# Patient Record
Sex: Male | Born: 1945
Health system: Southern US, Community
[De-identification: ages and names within clinical notes are randomized; demographics above are authoritative.]

## PROBLEM LIST (undated history)

## (undated) DIAGNOSIS — I672 Cerebral atherosclerosis: Secondary | ICD-10-CM

## (undated) DIAGNOSIS — E785 Hyperlipidemia, unspecified: Secondary | ICD-10-CM

## (undated) DIAGNOSIS — N401 Enlarged prostate with lower urinary tract symptoms: Secondary | ICD-10-CM

## (undated) DIAGNOSIS — R7303 Prediabetes: Secondary | ICD-10-CM

## (undated) DIAGNOSIS — I889 Nonspecific lymphadenitis, unspecified: Secondary | ICD-10-CM

## (undated) DIAGNOSIS — M1990 Unspecified osteoarthritis, unspecified site: Secondary | ICD-10-CM

## (undated) DIAGNOSIS — I10 Essential (primary) hypertension: Secondary | ICD-10-CM

## (undated) DIAGNOSIS — N189 Chronic kidney disease, unspecified: Secondary | ICD-10-CM

## (undated) DIAGNOSIS — G500 Trigeminal neuralgia: Secondary | ICD-10-CM

## (undated) DIAGNOSIS — J029 Acute pharyngitis, unspecified: Secondary | ICD-10-CM

## (undated) DIAGNOSIS — L299 Pruritus, unspecified: Secondary | ICD-10-CM

## (undated) DIAGNOSIS — J9801 Acute bronchospasm: Secondary | ICD-10-CM

## (undated) DIAGNOSIS — G4700 Insomnia, unspecified: Secondary | ICD-10-CM

## (undated) DIAGNOSIS — M109 Gout, unspecified: Secondary | ICD-10-CM

## (undated) DIAGNOSIS — M2010 Hallux valgus (acquired), unspecified foot: Secondary | ICD-10-CM

## (undated) DIAGNOSIS — R0600 Dyspnea, unspecified: Secondary | ICD-10-CM

## (undated) DIAGNOSIS — L309 Dermatitis, unspecified: Secondary | ICD-10-CM

## (undated) DIAGNOSIS — D126 Benign neoplasm of colon, unspecified: Secondary | ICD-10-CM

## (undated) DIAGNOSIS — I639 Cerebral infarction, unspecified: Secondary | ICD-10-CM

## (undated) DIAGNOSIS — M7061 Trochanteric bursitis, right hip: Secondary | ICD-10-CM

## (undated) DIAGNOSIS — K219 Gastro-esophageal reflux disease without esophagitis: Secondary | ICD-10-CM

## (undated) DIAGNOSIS — Z800 Family history of malignant neoplasm of digestive organs: Secondary | ICD-10-CM

## (undated) DIAGNOSIS — K921 Melena: Secondary | ICD-10-CM

## (undated) DIAGNOSIS — J309 Allergic rhinitis, unspecified: Secondary | ICD-10-CM

## (undated) DIAGNOSIS — L84 Corns and callosities: Secondary | ICD-10-CM

## (undated) DIAGNOSIS — F4310 Post-traumatic stress disorder, unspecified: Secondary | ICD-10-CM

## (undated) DIAGNOSIS — N529 Male erectile dysfunction, unspecified: Secondary | ICD-10-CM

## (undated) DIAGNOSIS — M5410 Radiculopathy, site unspecified: Secondary | ICD-10-CM

## (undated) DIAGNOSIS — R21 Rash and other nonspecific skin eruption: Secondary | ICD-10-CM

## (undated) DIAGNOSIS — H04123 Dry eye syndrome of bilateral lacrimal glands: Secondary | ICD-10-CM

## (undated) DIAGNOSIS — J383 Other diseases of vocal cords: Secondary | ICD-10-CM

## (undated) DIAGNOSIS — R972 Elevated prostate specific antigen [PSA]: Secondary | ICD-10-CM

## (undated) DIAGNOSIS — J3489 Other specified disorders of nose and nasal sinuses: Secondary | ICD-10-CM

## (undated) DIAGNOSIS — E559 Vitamin D deficiency, unspecified: Secondary | ICD-10-CM

## (undated) DIAGNOSIS — M65319 Trigger thumb, unspecified thumb: Secondary | ICD-10-CM

## (undated) DIAGNOSIS — G4730 Sleep apnea, unspecified: Secondary | ICD-10-CM

## (undated) DIAGNOSIS — K602 Anal fissure, unspecified: Secondary | ICD-10-CM

## (undated) HISTORY — DX: Trigeminal neuralgia: G50.0

## (undated) HISTORY — DX: Dermatitis, unspecified: L30.9

## (undated) HISTORY — DX: Trochanteric bursitis, right hip: M70.61

## (undated) HISTORY — DX: Anal fissure, unspecified: K60.2

## (undated) HISTORY — PX: HEMORRHOID SURGERY: SHX153

## (undated) HISTORY — DX: Nonspecific lymphadenitis, unspecified: I88.9

## (undated) HISTORY — DX: Hallux valgus (acquired), unspecified foot: M20.10

## (undated) HISTORY — DX: Family history of malignant neoplasm of digestive organs: Z80.0

## (undated) HISTORY — DX: Acute pharyngitis, unspecified: J02.9

## (undated) HISTORY — DX: Benign neoplasm of colon, unspecified: D12.6

## (undated) HISTORY — DX: Acute bronchospasm: J98.01

## (undated) HISTORY — DX: Male erectile dysfunction, unspecified: N52.9

## (undated) HISTORY — DX: Cerebral infarction, unspecified: I63.9

## (undated) HISTORY — DX: Benign prostatic hyperplasia with lower urinary tract symptoms: N40.1

## (undated) HISTORY — DX: Insomnia, unspecified: G47.00

## (undated) HISTORY — DX: Essential (primary) hypertension: I10

## (undated) HISTORY — DX: Trigger thumb, unspecified thumb: M65.319

## (undated) HISTORY — DX: Melena: K92.1

## (undated) HISTORY — DX: Gastro-esophageal reflux disease without esophagitis: K21.9

## (undated) HISTORY — DX: Radiculopathy, site unspecified: M54.10

## (undated) HISTORY — DX: Dyspnea, unspecified: R06.00

## (undated) HISTORY — DX: Dry eye syndrome of bilateral lacrimal glands: H04.123

## (undated) HISTORY — DX: Hyperlipidemia, unspecified: E78.5

## (undated) HISTORY — DX: Allergic rhinitis, unspecified: J30.9

## (undated) HISTORY — DX: Gout, unspecified: M10.9

## (undated) HISTORY — DX: Cerebral atherosclerosis: I67.2

## (undated) HISTORY — DX: Elevated prostate specific antigen (PSA): R97.20

## (undated) HISTORY — DX: Corns and callosities: L84

## (undated) HISTORY — DX: Pruritus, unspecified: L29.9

## (undated) HISTORY — DX: Vitamin D deficiency, unspecified: E55.9

## (undated) HISTORY — PX: HERNIA REPAIR: SHX51

## (undated) HISTORY — DX: Rash and other nonspecific skin eruption: R21

---

## 1995-08-21 DIAGNOSIS — I639 Cerebral infarction, unspecified: Secondary | ICD-10-CM

## 1995-08-21 HISTORY — DX: Cerebral infarction, unspecified: I63.9

## 1999-07-08 ENCOUNTER — Ambulatory Visit: Admission: RE | Admit: 1999-07-08 | Discharge: 1999-07-08 | Payer: Self-pay | Admitting: Internal Medicine

## 1999-12-21 ENCOUNTER — Encounter: Admission: RE | Admit: 1999-12-21 | Discharge: 1999-12-21 | Payer: Self-pay | Admitting: Internal Medicine

## 1999-12-21 ENCOUNTER — Encounter: Payer: Self-pay | Admitting: Internal Medicine

## 2001-06-27 ENCOUNTER — Ambulatory Visit (HOSPITAL_BASED_OUTPATIENT_CLINIC_OR_DEPARTMENT_OTHER): Admission: RE | Admit: 2001-06-27 | Discharge: 2001-06-27 | Payer: Self-pay | Admitting: Internal Medicine

## 2002-04-23 ENCOUNTER — Encounter: Admission: RE | Admit: 2002-04-23 | Discharge: 2002-04-23 | Payer: Self-pay | Admitting: Internal Medicine

## 2002-04-23 ENCOUNTER — Encounter: Payer: Self-pay | Admitting: Internal Medicine

## 2004-10-17 ENCOUNTER — Ambulatory Visit: Payer: Self-pay | Admitting: Internal Medicine

## 2007-01-29 ENCOUNTER — Encounter: Admission: RE | Admit: 2007-01-29 | Discharge: 2007-01-29 | Payer: Self-pay | Admitting: Internal Medicine

## 2008-06-25 ENCOUNTER — Ambulatory Visit (HOSPITAL_COMMUNITY): Admission: RE | Admit: 2008-06-25 | Discharge: 2008-06-25 | Payer: Self-pay | Admitting: Orthopedic Surgery

## 2008-08-20 HISTORY — PX: ROTATOR CUFF REPAIR: SHX139

## 2008-08-26 ENCOUNTER — Ambulatory Visit (HOSPITAL_BASED_OUTPATIENT_CLINIC_OR_DEPARTMENT_OTHER): Admission: RE | Admit: 2008-08-26 | Discharge: 2008-08-27 | Payer: Self-pay | Admitting: Orthopedic Surgery

## 2008-09-17 ENCOUNTER — Encounter: Admission: RE | Admit: 2008-09-17 | Discharge: 2008-09-17 | Payer: Self-pay | Admitting: Neurology

## 2010-12-04 LAB — BASIC METABOLIC PANEL
BUN: 15 mg/dL (ref 6–23)
CO2: 28 mEq/L (ref 19–32)
Calcium: 9.1 mg/dL (ref 8.4–10.5)
Chloride: 104 mEq/L (ref 96–112)
Creatinine, Ser: 1.15 mg/dL (ref 0.4–1.5)
GFR calc Af Amer: >60 mL/min (ref >60)
GFR calc non Af Amer: >60 mL/min (ref >60)
Glucose, Bld: 91 mg/dL (ref 70–99)
Potassium: 3.8 mEq/L (ref 3.5–5.1)
Sodium: 138 mEq/L (ref 135–145)

## 2010-12-04 LAB — POCT HEMOGLOBIN-HEMACUE: Hemoglobin: 13.5 g/dL (ref 13.0–17.0)

## 2011-01-02 NOTE — Op Note (Signed)
[name], [name]              ACCOUNT NO.:  [id number]   MEDICAL RECORD NO.:  [id number]          PATIENT TYPE:  AMB   LOCATION:  DSC                          FACILITY:  [facility]   PHYSICIAN:  Katy Fitch. Sypher, M.D. DATE OF BIRTH:  1946/05/05   DATE OF PROCEDURE:  08/26/2008  DATE OF DISCHARGE:                               OPERATIVE REPORT   PREOPERATIVE DIAGNOSES:  Chronic pain, left shoulder with weakness of  abduction and external rotation and plain film evidence of a type 3  acromion and MRI evidence documenting a retracted supraspinatus and  partial infraspinatus rotator cuff tear with subscapularis tendinopathy  and biceps tendinopathy.   POSTOPERATIVE DIAGNOSES:  Chronic pain, left shoulder with weakness of  abduction and external rotation and plain film evidence of a type 3  acromion and MRI evidence documenting a retracted supraspinatus and  partial infraspinatus rotator cuff tear with subscapularis tendinopathy  and biceps tendinopathy. Confirmation of retracted rotator cuff tear  involving supraspinatus and infraspinatus tendons, grade 1 subscapularis  tendinopathy, adhesive capsulitis, a Buford complex, and significant  biceps tendinopathy with a 50% biceps degenerative tear.   OPERATIONS:  1. Examination of left shoulder under anesthesia.  2. Diagnostic arthroscopy, left shoulder.  3. Arthroscopic debridement of synovitis, capsulectomy, and labral      debridement.  4. Arthroscopic subacromial decompression with coracoacromial ligament      relaxation and correction of a type 3 acromion to a type 1      acromion.  We elected to leave the acromioclavicular joint intact,      as the distal clavicle did not appear to be contributing to      impingement.  5. Open repair of 2-tendon rotator cuff tear involving infraspinatus,      supraspinatus tendons.   OPERATIONS:  Katy Fitch. Sypher, MD   ASSISTANT:  Marveen Reeks Dasnoit, PA-C   ANESTHESIA:  General endotracheal  supplemented by left interscalene  block.   SUPERVISING ANESTHESIOLOGIST:  Kaylyn Layer. Michelle Piper, MD   INDICATIONS:  [name] is a 66 year old gentleman referred through  the courtesy of Dr. Johnella Moloney for evaluation of a painful and weak  left shoulder.  Clinical examination revealed weakness of abduction and  external rotation and findings compatible with a rotator cuff tear.  Plain films of the shoulder documented a type 3 acromion.  [name]  had similar bony pathology on the right.  An MRI of the left shoulder  confirmed a retracted rotator cuff tear involving supraspinatus,  infraspinatus tendons.   After a lengthy informed consent in the office, he is brought to the  operating room at this time anticipating a diagnostic arthroscopy,  appropriate debridement, subacromial decompression, correction of the  type 3 acromion, possible distal clavicle resection, and repair of the  rotator cuff.   Preoperatively, questions invited and answered with [name] in  detail.  He was informed of the potential risks and benefits of surgery,  the benefits being pain relief and recovery of strength and function.  The risks being infection, failure to relieve all his pain, anesthetic  complications, possible late hardware  issues.   He was interviewed preoperatively by Dr. Michelle Piper and agreed to a left  interscalene block.  This was placed without complication in the holding  area.   PROCEDURE:  [name] was brought to the operating room and placed  in a supine position on the operating table.   Following the induction of general endotracheal anesthesia under Dr.  Deirdre Priest direct supervision, [name] was carefully positioned in a  beach chair position with the aid of a torso and head holder designed  from arthroscopy.  The entire left upper extremity forequarter prepped  with DuraPrep and draped with Impervious arthroscopy drapes.  An  examination of the left shoulder under  anesthesia was accomplished  revealing combined elevation 170, external rotation 70, internal  rotation 40, and lack of extension of about 20 degrees due to capsular  tightness.  The scope was then placed through a posterior portal after  anterior instrumentation with a switching stick.  Diagnostic arthroscopy  confirmed abundant adhesive capsulitis granulation tissue, a Buford  complex involving the anterior superior glenohumeral ligament.  A  significant degree of hypertrophic scar anteriorly obliterating easy  view of the subscapularis and a grade 1 subscapularis degenerative tear.  The deep surface of the supraspinatus and infraspinatus tendons were  inspected and found to reveal a 3-cm retracted tear of the conjoined  supraspinatus, infraspinatus tendons.  This was degenerative in nature.  There was considerable articular side degeneration of the infraspinatus.   Suction shaver was used to debride the infraspinatus over the greater  tuberosity.   The scope was then removed from the glenohumeral joint and placed in a  subacromial space.  The bursa was cleared and the type 3 acromion  documented with the digital camera.  The coracoacromial ligament was  released followed by leveling the acromion to a type 1 morphology while  preserving the coracoacromial ligament.  The capsule of the [facility] joint was  identified and not found to be problematic.  After limited debridement  of the fat pad, we elected to leave the [facility] joint intact.  Hemostasis was  achieved followed by conversion to a muscle-splitting incision for mini  open repair of the rotator cuff.   A 4-cm incision was fashioned at the anterolateral corner of the  acromion followed by splitting of the deltoid.  A Chung retractor was  placed followed by bursectomy.  The margins of the tear were freshened  with a scalpel and rongeur followed by placement of a baseball stitch of  #2 FiberWire converging the margins of the tear.  The  footprint at the  greater tuberosity was 4 cm in width and approximately 15-17 mm deep.  This was decorticated and the profile of the greater tuberosity lowered  to the bur followed by placement of a medial Bio-Corkscrew at the  junction between the infraspinatus and supraspinatus tendons at the  joint line.   The through bone sutures were then passed through bone tunnels,  tensioned appropriately, and a pair of mattress sutures were used to  inset the margin of the repair at the articular surface.  The medial  suture was tied with a knot pusher due to our inability to access the  subacromial space.   The through bone sutures were tensioned and anatomic footprint was  reestablished.   The scope was then replaced in the glenohumeral joint confirming  correction of the rotator cuff tear with inset of the tendons to the  articular margin.  The joint was debrided  of all clot and other debris  and irrigated.  The subacromial space was likewise irrigated followed by  repair of the deltoid split with corner suture of #2 FiberWire and  figure-of-eight sutures of 0 Vicryl.  Skin was repaired with  subcutaneous suture of 2-0 Vicryl and intradermal 3-0 Prolene.  There  were no apparent complications.   [name] tolerated the surgery and the anesthesia well.  He was  transferred to the recovery room in stable signs.  He will be discharged  to [facility] for observation of his vital  signs, appropriate analgesics in the form of p.o. and IV Dilaudid, and  Ancef 1 g IV q.8 h. x3 doses with a prophylactic antibiotic.      Katy Fitch Sypher, M.D.  Electronically Signed     RVS/MEDQ  D:  08/26/2008  T:  08/27/2008  Job:  [id number]   cc:   Candyce Churn, M.D.

## 2011-04-27 ENCOUNTER — Encounter (INDEPENDENT_AMBULATORY_CARE_PROVIDER_SITE_OTHER): Payer: Self-pay | Admitting: General Surgery

## 2011-04-30 ENCOUNTER — Encounter (INDEPENDENT_AMBULATORY_CARE_PROVIDER_SITE_OTHER): Payer: Self-pay | Admitting: General Surgery

## 2011-04-30 ENCOUNTER — Ambulatory Visit (INDEPENDENT_AMBULATORY_CARE_PROVIDER_SITE_OTHER): Payer: 59 | Admitting: General Surgery

## 2011-04-30 VITALS — BP 120/82 | HR 60 | Temp 98.3°F | Ht 65.0 in | Wt 182.0 lb

## 2011-04-30 DIAGNOSIS — K602 Anal fissure, unspecified: Secondary | ICD-10-CM

## 2011-04-30 DIAGNOSIS — K648 Other hemorrhoids: Secondary | ICD-10-CM

## 2011-04-30 DIAGNOSIS — K644 Residual hemorrhoidal skin tags: Secondary | ICD-10-CM

## 2011-04-30 NOTE — Patient Instructions (Signed)
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 This is the source of your severe pain and possibly contributing to your bleeding. Your internal hemorrhoids may also be contributing to your bleeding. Your external hemorrhoids are not causing any problem. We will schedule you for surgery to repair the anal fissure, perform a sphincterotomy, and inject  sclerosing solution into internal hemorrhoids as necessary.

## 2011-04-30 NOTE — Progress Notes (Signed)
Chief Complaint  Patient presents with  . Follow-up    anal fissure    HPI [name] is a 65 y.o. male.    This patient returns to see me because he is very symptomatic from his anal fissure. He says that he has recovered from his shoulder surgery. He got temporary relief from the medical therapy that was prescribed when I saw him back in May of 2011. He now has soft bowel movements but almost daily severe pain with  during and after bowel movements, and he sees some blood as well. He does know that he has external hemorrhoids but he does not think they are bothering him very much. Last colonoscopy 2012-[facility].  Significant medications are Diovan, HCT, Lipitor, daily aspirin, stool softeners and vitamin D.  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 He has minimal left-sided residual symptoms. He does not have any heart disease diabetes or pulmonary disease. HPI  Past Medical History  Diagnosis Date  . Hypertension   . Hyperlipidemia   . Hemorrhoids   . Anal fissure     Past Surgical History  Procedure Date  . Rotator cuff repair 2010    left    Family History  Problem Relation Age of Onset  . Heart disease Mother   . Cancer Father     Social History History  Substance Use Topics  . Smoking status: Former Games developer  . Smokeless tobacco: Not on file  . Alcohol Use: No    No Known Allergies  Current Outpatient Prescriptions  Medication Sig Dispense Refill  . aspirin 325 MG tablet Take 325 mg by mouth daily.        Marland Kitchen atorvastatin (LIPITOR) 10 MG tablet Take 10 mg by mouth daily.        . cholecalciferol (VITAMIN D) 1000 UNITS tablet Take 1,000 Units by mouth daily.        [name] Calcium (STOOL SOFTENER PO) Take by mouth daily.        . valsartan-hydrochlorothiazide (DIOVAN-HCT) 160-12.5 MG per tablet Take 1 tablet by mouth daily.          Review of  Systems Review of Systems  Respiratory: Negative.   Cardiovascular: Negative.   Gastrointestinal: Positive for constipation, blood in stool and rectal pain. Negative for nausea, vomiting, abdominal pain, diarrhea, abdominal distention and anal bleeding.  Genitourinary: Negative.   Musculoskeletal: Negative.   Skin: Negative.   Neurological: Positive for weakness and numbness. Negative for dizziness, tremors, seizures, syncope, facial asymmetry, speech difficulty and headaches.  Hematological: Negative.   Psychiatric/Behavioral: Negative.     Blood pressure 120/82, pulse 60, temperature 98.3 F (36.8 C), temperature source Temporal, height 5\' 5"  (1.651 m), weight 182 lb (82.555 kg).  Physical Exam Physical Exam  Constitutional: He is oriented to person, place, and time. He appears well-developed and well-nourished. No distress.  HENT:  Head: Normocephalic and atraumatic.  Nose: Nose normal.  Mouth/Throat: No oropharyngeal exudate.  Eyes: Conjunctivae are normal. Pupils are equal, round, and reactive to light. Left eye exhibits no discharge. No scleral icterus.  Neck: Neck supple. No JVD present. No tracheal deviation present. No thyromegaly present.  Cardiovascular: Normal rate, regular rhythm, normal heart sounds and intact distal pulses.  Exam reveals no gallop.   No murmur heard. Pulmonary/Chest: Effort normal. No respiratory distress. He has no wheezes. He has no rales. He exhibits  no tenderness.  Abdominal: Bowel sounds are normal. He exhibits no distension. There is no tenderness. There is no rebound and no guarding.  Genitourinary:     Musculoskeletal: Normal range of motion. He exhibits no edema and no tenderness.  Lymphadenopathy:    He has no cervical adenopathy.  Neurological: He is alert and oriented to person, place, and time. He exhibits normal muscle tone.  Skin: Skin is warm and dry. No rash noted. No erythema. No pallor.  Psychiatric: He has a normal mood and  affect. His behavior is normal. Judgment and thought content normal.    Data Reviewed I have reviewed our old chart with previous rectal information.  Assessment    Chronic anal fissure, posterior midline, very symptomatic.  Internal hemorrhoids, possibly contributing to bleeding.  External hemorrhoids, asymptomatic.  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  Hypertension.     Plan    Scheduled for examination under anesthesia, repair anal fissure, internal sphincterotomy, inject sclerosing solution into internal hemorrhoids as necessary.  There does not appear to be any indication for surgical intervention on his small circumferential external hemorrhoids.  Discontinue aspirin two days preop.       [name] M 04/30/2011, 9:28 AM

## 2011-05-08 ENCOUNTER — Ambulatory Visit (HOSPITAL_COMMUNITY)
Admission: RE | Admit: 2011-05-08 | Discharge: 2011-05-08 | Disposition: A | Discharge status: Home / Self Care | Payer: 59 | Source: Ambulatory Visit | Attending: General Surgery | Admitting: General Surgery

## 2011-05-08 ENCOUNTER — Other Ambulatory Visit (INDEPENDENT_AMBULATORY_CARE_PROVIDER_SITE_OTHER): Payer: Self-pay | Admitting: General Surgery

## 2011-05-08 ENCOUNTER — Encounter (HOSPITAL_COMMUNITY): Payer: 59

## 2011-05-08 DIAGNOSIS — I10 Essential (primary) hypertension: Secondary | ICD-10-CM | POA: Insufficient documentation

## 2011-05-08 DIAGNOSIS — Z01818 Encounter for other preprocedural examination: Secondary | ICD-10-CM

## 2011-05-08 DIAGNOSIS — Z79899 Other long term (current) drug therapy: Secondary | ICD-10-CM | POA: Insufficient documentation

## 2011-05-08 DIAGNOSIS — K602 Anal fissure, unspecified: Secondary | ICD-10-CM | POA: Insufficient documentation

## 2011-05-08 DIAGNOSIS — Z8673 Personal history of transient ischemic attack (TIA), and cerebral infarction without residual deficits: Secondary | ICD-10-CM | POA: Insufficient documentation

## 2011-05-08 DIAGNOSIS — Z01810 Encounter for preprocedural cardiovascular examination: Secondary | ICD-10-CM | POA: Insufficient documentation

## 2011-05-08 DIAGNOSIS — Z01812 Encounter for preprocedural laboratory examination: Secondary | ICD-10-CM | POA: Insufficient documentation

## 2011-05-08 DIAGNOSIS — K648 Other hemorrhoids: Secondary | ICD-10-CM | POA: Insufficient documentation

## 2011-05-08 LAB — DIFFERENTIAL
Lymphocytes Relative: 26 % (ref 12–46)
Monocytes Absolute: 0.5 10*3/uL (ref 0.1–1.0)
Monocytes Relative: 7 % (ref 3–12)
Neutro Abs: 4.1 10*3/uL (ref 1.7–7.7)

## 2011-05-08 LAB — SURGICAL PCR SCREEN: MRSA, PCR: NEGATIVE

## 2011-05-08 LAB — CBC
HCT: 38.8 % — ABNORMAL LOW (ref 39.0–52.0)
Hemoglobin: 12.8 g/dL — ABNORMAL LOW (ref 13.0–17.0)
MCH: 27.1 pg (ref 26.0–34.0)
MCHC: 33 g/dL (ref 30.0–36.0)
RBC: 4.72 MIL/uL (ref 4.22–5.81)

## 2011-05-08 LAB — URINALYSIS, ROUTINE W REFLEX MICROSCOPIC
Glucose, UA: NEGATIVE mg/dL
Hgb urine dipstick: NEGATIVE
Ketones, ur: NEGATIVE mg/dL
Protein, ur: NEGATIVE mg/dL
pH: 6.5 (ref 5.0–8.0)

## 2011-05-08 LAB — COMPREHENSIVE METABOLIC PANEL
AST: 19 U/L (ref 0–37)
Albumin: 3.8 g/dL (ref 3.5–5.2)
Calcium: 9.4 mg/dL (ref 8.4–10.5)
Creatinine, Ser: 0.97 mg/dL (ref 0.50–1.35)
Total Protein: 7.6 g/dL (ref 6.0–8.3)

## 2011-05-11 ENCOUNTER — Ambulatory Visit (HOSPITAL_COMMUNITY)
Admission: RE | Admit: 2011-05-11 | Discharge: 2011-05-11 | Disposition: A | Discharge status: Home / Self Care | Payer: 59 | Source: Ambulatory Visit | Attending: General Surgery | Admitting: General Surgery

## 2011-05-11 DIAGNOSIS — K219 Gastro-esophageal reflux disease without esophagitis: Secondary | ICD-10-CM | POA: Insufficient documentation

## 2011-05-11 DIAGNOSIS — I10 Essential (primary) hypertension: Secondary | ICD-10-CM | POA: Insufficient documentation

## 2011-05-11 DIAGNOSIS — Z01818 Encounter for other preprocedural examination: Secondary | ICD-10-CM | POA: Insufficient documentation

## 2011-05-11 DIAGNOSIS — Z01810 Encounter for preprocedural cardiovascular examination: Secondary | ICD-10-CM | POA: Insufficient documentation

## 2011-05-11 DIAGNOSIS — K602 Anal fissure, unspecified: Secondary | ICD-10-CM | POA: Insufficient documentation

## 2011-05-11 DIAGNOSIS — Z01812 Encounter for preprocedural laboratory examination: Secondary | ICD-10-CM | POA: Insufficient documentation

## 2011-05-11 DIAGNOSIS — K648 Other hemorrhoids: Secondary | ICD-10-CM | POA: Insufficient documentation

## 2011-05-11 HISTORY — PX: ANAL FISSURE REPAIR: SHX2312

## 2011-05-12 NOTE — Op Note (Signed)
NAMEJAMARL, [name]              ACCOUNT NO.:  [id number]  MEDICAL RECORD NO.:  [id number]  LOCATION:  DAYL                         FACILITY:  [facility]  PHYSICIAN:  Angelia Mould. Derrell Lolling, M.D.DATE OF BIRTH:  06/27/1946  DATE OF PROCEDURE:  05/11/2011 DATE OF DISCHARGE:                              OPERATIVE REPORT   PREOPERATIVE DIAGNOSES: 1. Chronic anal fissure, posterior midline. 2. Bleeding internal hemorrhoids.  POSTOPERATIVE DIAGNOSES: 1. Chronic anal fissure, posterior midline. 2. Bleeding internal hemorrhoids.  OPERATION PERFORMED:  Examination under anesthesia, lateral internal sphincterotomy (right lateral), anal stretch, inject sclerosing solution internal hemorrhoids, 3 columns.  SURGEON:  Angelia Mould. Derrell Lolling, MD  OPERATIVE INDICATIONS:  This is a 65 year old African American gentleman who has become very symptomatic from an anal fissure.  He has had problems off and on, but basically has not responded well to topical therapy and diltiazem cream and stool softeners.  His bowel movements were soft, but he has pain with every bowel movement and he sees some blood as well.  He has had a colonoscopy this year which showed no other abnormalities.  On exam, he has a chronic anal fissure in the posterior midline.  He has small circumferential external hemorrhoids, which are not symptomatic.  He is brought to the operating room electively.  OPERATIVE FINDINGS:  The patient had a chronic anal fissure in the posterior midline.  The sphincter tone was increased, but there was no stenosis.  He had internal hemorrhoids right anterior, right posterior, and left lateral.  There was no other mucosal abnormality.  OPERATIVE TECHNIQUE:  Following the induction of general endotracheal anesthesia, intravenous antibiotics were given.  The patient was placed in a lithotomy position.  The perianal area was prepped and draped in a sterile fashion.  Surgical time-out was held identifying  correct patient and correct procedure.  External exam revealed minimal, but circumferential external hemorrhoids.  Digital rectal exam revealed increased sphincter tone, but no stenosis.  I slowly dilated the sphincter until I could insert the operating anoscope.  Findings were as described above.  I positioned the anoscope to expose the right lateral anal wall.  I could easily palpate the intersphincteric groove between the internal and external sphincters.  I took a #11 knife blade and inserted it between the internal and external sphincters and then turned it toward the lumen and divided the sphincter muscle leaving the mucosa intact.  I removed the knife and then held some pressure on this.  I then slowly dilated the anal sphincter until I could get 4 fingers in.  I took sclerosing solution with phenol and almond oil, which had been compounded by our pharmacy.  I injected this into the internal hemorrhoids in the right anterior, right posterior, and left lateral positions.  The anal fissure in the posterior midline was cauterized extensively and a little bit of redundant skin tissue on the anoderm was excised.  This wound was left open.  I then took 20 cc of Exparel and mixed it with 20 cc of injectable saline.  I injected this extensively into the perianal sphincter muscles for postop analgesia.  The wound was observed for several minutes.  There was no bleeding.  External bandages were placed.  He tolerated the procedure well, was taken to the recovery room in stable condition.  Estimated blood loss was about 10 cc. Complications, none.  Sponge, needle, and instrument counts were correct.     Angelia Mould. Derrell Lolling, M.D.     HMI/MEDQ  D:  05/11/2011  T:  05/11/2011  Job:  [id number]  cc:   Pearla Dubonnet, M.D. Fax: [phone]  Electronically Signed by Claud Kelp M.D. on 05/12/2011 08:17:38 AM

## 2011-05-28 ENCOUNTER — Encounter (INDEPENDENT_AMBULATORY_CARE_PROVIDER_SITE_OTHER): Payer: Self-pay | Admitting: General Surgery

## 2011-05-28 ENCOUNTER — Ambulatory Visit (INDEPENDENT_AMBULATORY_CARE_PROVIDER_SITE_OTHER): Payer: 59 | Admitting: General Surgery

## 2011-05-28 VITALS — BP 110/74 | HR 64 | Temp 97.8°F | Resp 16 | Ht 65.0 in | Wt 181.6 lb

## 2011-05-28 DIAGNOSIS — Z9889 Other specified postprocedural states: Secondary | ICD-10-CM

## 2011-05-28 NOTE — Progress Notes (Signed)
Subjective:     Patient ID: [name], male   DOB: 1946-02-10, 65 y.o.   MRN: [id number]  HPI This 65 year old gentleman underwent examination under anesthesia, lateral internal sphincterotomy, anal stretch, sclerosis of internal hemorrhoids. He had an anal fissure in the posterior midline and internal hemorrhoids.  He states that his pain is 90% better and he has minimal bleeding. He still wears a small pad and is still using baby wipes. He is keeping his stool soft. He was to go back to work. He is very pleased with his progress. Review of Systems     Objective:   Physical Exam The patient looks well and is in good spirits.  Anal exam shows a clean healing wound in the posterior midline, consistent with his anal fissure. He has a small soft external hemorrhoids. There is no cellulitis or purulence. There is no tenderness.    Assessment:     Chronic anal fissure, posterior midline, and internal hemorrhoids, symptomatic, doing well following injection of sclerosing solution into internal hemorrhoids, sphincterotomy and repair of anal fissure posterior midline.  External hemorrhoids, asymptomatic.   Plan:     He is advised to stay well hydrated, take daily stool softeners, and adhere to a high-fiber low-fat diet.  He is told that the wound should completely healed and all of the pain and all of the bleeding should resolve within 4 weeks.  If all symptoms resolved as anticipated, he may return to see me p.r.n.  If symptoms persist beyond that point, he is advised to return to see me for an exam.

## 2011-05-28 NOTE — Patient Instructions (Signed)
Your wounds appear to be healing very well. I would expect all discomfort and all bleeding to go away within 4 weeks. Return to see me if any bleeding or pain persists beyond that point in time. Stay well hydrated. Each lots of fruits and vegetables. Daily fiber supplement is advised.

## 2013-12-15 ENCOUNTER — Ambulatory Visit (INDEPENDENT_AMBULATORY_CARE_PROVIDER_SITE_OTHER): Payer: 59 | Admitting: Diagnostic Neuroimaging

## 2013-12-15 ENCOUNTER — Encounter: Payer: Self-pay | Admitting: Diagnostic Neuroimaging

## 2013-12-15 ENCOUNTER — Other Ambulatory Visit (INDEPENDENT_AMBULATORY_CARE_PROVIDER_SITE_OTHER): Payer: Self-pay

## 2013-12-15 ENCOUNTER — Encounter (INDEPENDENT_AMBULATORY_CARE_PROVIDER_SITE_OTHER): Payer: Self-pay

## 2013-12-15 VITALS — BP 120/74 | HR 72 | Ht 64.5 in | Wt 187.0 lb

## 2013-12-15 DIAGNOSIS — Z0289 Encounter for other administrative examinations: Secondary | ICD-10-CM

## 2013-12-15 DIAGNOSIS — H02402 Unspecified ptosis of left eyelid: Secondary | ICD-10-CM

## 2013-12-15 DIAGNOSIS — R51 Headache: Secondary | ICD-10-CM

## 2013-12-15 DIAGNOSIS — R519 Headache, unspecified: Secondary | ICD-10-CM

## 2013-12-15 DIAGNOSIS — H02409 Unspecified ptosis of unspecified eyelid: Secondary | ICD-10-CM

## 2013-12-15 NOTE — Progress Notes (Signed)
[facility]  PATIENT: [name] DOB: August 16, 1946  REFERRING CLINICIAN: Zaldivar HISTORY FROM: patient  REASON FOR VISIT: new consult   HISTORICAL  CHIEF COMPLAINT:  Chief Complaint  Patient presents with  . 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  Patient and his coworkers and family have noticed intermittent left eyelid drooping, typically in the afternoon around 2 to 3:00. Symptoms started in 2011. Symptoms have fluctuated on a day-to-day basis. Sometimes his left eyelid is drooping so much that it is completely shut. He denies any double vision, blurred vision, swallowing problems, slurred speech, breathing problems, generalized muscle weakness. No neck weakness. Patient was referred to oculoplastic surgeon for ptosis repair, who then referred patient to me to rule out any other neurologic cause.  Patient's left ptosis seems to be worse when he is having headache on left side. Patient continues to have developed aching left occipital, temporal, frontal, periauricular pain sometimes with numbness.  REVIEW OF SYSTEMS: Full 14 system review of systems performed and notable only for snoring headache numbness weakness.  ALLERGIES: No Known Allergies  HOME MEDICATIONS: Outpatient Prescriptions Prior to Visit  Medication Sig Dispense Refill  . aspirin 325 MG tablet Take 325 mg by mouth daily.        Marland Kitchen atorvastatin (LIPITOR) 10 MG tablet Take 10 mg by mouth daily.        . cholecalciferol (VITAMIN D) 1000 UNITS tablet Take 1,000 Units by mouth daily.        Mariane Baumgarten Calcium (STOOL SOFTENER PO) Take by mouth daily.        . valsartan-hydrochlorothiazide (DIOVAN-HCT) 160-12.5 MG per tablet Take  1 tablet by mouth daily.         No facility-administered medications prior to visit.    PAST MEDICAL HISTORY: Past Medical History  Diagnosis Date  . Hypertension   . Hyperlipidemia   . Hemorrhoids   . Anal fissure     PAST SURGICAL HISTORY: Past Surgical History  Procedure Laterality Date  . Rotator cuff repair  2010    left  . Anal fissure repair  05/11/11    FAMILY HISTORY: Family History  Problem Relation Age of Onset  . Heart disease Mother   . Cancer Father     stomach  . Cancer Paternal Uncle     prostate    SOCIAL HISTORY:  History   Social History  . Marital Status: Married    Spouse Name: N/A    Number of Children: 1  . Years of Education: masters   Occupational History  .  [employer]   Social History Main Topics  . Smoking status: Former Research scientist (life sciences)  . Smokeless tobacco: Not on file  . Alcohol Use: No  . Drug Use: No  . Sexual Activity: Not on file   Other Topics Concern  . Not on file   Social History Narrative   Patient lives at home with his wife.     PHYSICAL EXAM  Filed Vitals:   12/15/13 1126  BP: 120/74  Pulse: 72  Height: 5' 4.5" (1.638 m)  Weight: 187 lb (84.823 kg)    Not recorded    Body mass index is 31.61 kg/(m^2).  GENERAL EXAM: Patient is in no distress; well developed, nourished  and groomed; neck is supple; NECK EXT/FLEX 5/5.  CARDIOVASCULAR: Regular rate and rhythm, no murmurs, no carotid bruits  NEUROLOGIC: MENTAL STATUS: awake, alert, oriented to person, place and time, recent and remote memory intact, normal attention and concentration, language fluent, comprehension intact, naming intact, fund of knowledge appropriate CRANIAL NERVE: no papilledema on fundoscopic exam, pupils equal and reactive to light, visual fields full to confrontation, extraocular muscles intact, no nystagmus, facial sensation and strength symmetric, MILD LEFT PTOSIS, FLUCTUATES DURING EXAM. NO SIGNIFICANT WORSENING AFTER 1 MINUTE  UPGAZE. NO DIPLOPIA. hearing intact, palate elevates symmetrically, uvula midline, shoulder shrug symmetric, tongue midline. ICE PACK TEST ON LEFT EYE NEGATIVE. MOTOR: normal bulk and tone, full strength in the BUE, BLE SENSORY: normal and symmetric to light touch, pinprick, temperature, vibration; EXCEPT MILD DECR VIB IN LEFT LEG. COORDINATION: finger-nose-finger, fine finger movements normal REFLEXES: deep tendon reflexes present and symmetric GAIT/STATION: narrow based gait; able to walk on toes, heels and tandem; romberg is negative    DIAGNOSTIC DATA (LABS, IMAGING, TESTING) - I reviewed patient records, labs, notes, testing and imaging myself where available.  Lab Results  Component Value Date   WBC 6.6 05/08/2011   HGB 12.8* 05/08/2011   HCT 38.8* 05/08/2011   MCV 82.2 05/08/2011   PLT 182 05/08/2011      Component Value Date/Time   NA 137 05/08/2011 0905   K 4.0 05/08/2011 0905   CL 100 05/08/2011 0905   CO2 29 05/08/2011 0905   GLUCOSE 97 05/08/2011 0905   BUN 16 05/08/2011 0905   CREATININE 0.97 05/08/2011 0905   CALCIUM 9.4 05/08/2011 0905   PROT 7.6 05/08/2011 0905   ALBUMIN 3.8 05/08/2011 0905   AST 19 05/08/2011 0905   ALT 19 05/08/2011 0905   ALKPHOS 61 05/08/2011 0905   BILITOT 0.6 05/08/2011 0905   GFRNONAA >60 05/08/2011 0905   GFRAA >60 05/08/2011 0905   No results found for this basename: CHOL, HDL, LDLCALC, LDLDIRECT, TRIG, CHOLHDL   No results found for this basename: HGBA1C   No results found for this basename: VITAMINB12   No results found for this basename: TSH      ASSESSMENT AND PLAN  68 y.o. year old male here with intermittent, fluctuating left-sided ptosis since 2011, sometimes associated with increasing left-sided headaches. Will pursue further workup.  Ddx: ocular myasthenia, intracranial aneurysm, cranial neuropathy (CNIII), CNS inflamm/vascular, age related ptosis, left horner's, myopathy, thyroid disease  PLAN: Orders Placed This Encounter    Procedures  . MR Brain W Wo Contrast  . MR MRA HEAD WO CONTRAST  . Acetylcholine Receptor, Binding  . CK  . Aldolase  . TSH   Return in about 6 months (around 06/16/2014).    Penni Bombard, MD 11/29/8784, 76:72 PM Certified in Neurology, Neurophysiology and [facility] [address], [place], [place] [address] [phone]

## 2013-12-15 NOTE — Patient Instructions (Signed)
I will check additional testing. 

## 2013-12-17 LAB — ALDOLASE: Aldolase: 5.4 U/L (ref 3.3–10.3)

## 2013-12-17 LAB — TSH: TSH: 1.06 u[IU]/mL (ref 0.450–4.500)

## 2013-12-17 LAB — CK: Total CK: 268 U/L — ABNORMAL HIGH (ref 24–204)

## 2013-12-17 LAB — ACETYLCHOLINE RECEPTOR, BINDING: AChR Binding Ab, Serum: 0.08 nmol/L (ref 0.00–0.24)

## 2013-12-24 ENCOUNTER — Ambulatory Visit (INDEPENDENT_AMBULATORY_CARE_PROVIDER_SITE_OTHER): Payer: 59

## 2013-12-24 DIAGNOSIS — H02402 Unspecified ptosis of left eyelid: Secondary | ICD-10-CM

## 2013-12-24 DIAGNOSIS — H02409 Unspecified ptosis of unspecified eyelid: Secondary | ICD-10-CM

## 2013-12-24 DIAGNOSIS — R51 Headache: Secondary | ICD-10-CM

## 2013-12-24 DIAGNOSIS — R519 Headache, unspecified: Secondary | ICD-10-CM

## 2013-12-24 MED ORDER — GADOPENTETATE DIMEGLUMINE 469.01 MG/ML IV SOLN: 14.0000 mL | Freq: Once | INTRAVENOUS | Status: AC | PRN

## 2014-01-25 ENCOUNTER — Telehealth: Payer: Self-pay | Admitting: Diagnostic Neuroimaging

## 2014-01-25 NOTE — Telephone Encounter (Signed)
Patient requesting MRI results

## 2014-01-25 NOTE — Telephone Encounter (Signed)
Called and left Vm message that MRI results have not been reviewed by physician, once reviewed will call back with those results.

## 2014-01-29 NOTE — Telephone Encounter (Signed)
I called patient and reviewed. Nothing to explain symptoms. Labs notable for slight elevation of CK. Will see patient at next visit and repeat CK and check anti-MUSK ab. -VRP

## 2014-06-16 ENCOUNTER — Encounter: Payer: Self-pay | Admitting: Diagnostic Neuroimaging

## 2014-06-16 ENCOUNTER — Ambulatory Visit (INDEPENDENT_AMBULATORY_CARE_PROVIDER_SITE_OTHER): Payer: 59 | Admitting: Diagnostic Neuroimaging

## 2014-06-16 VITALS — BP 125/80 | HR 80 | Temp 98.4°F | Ht 65.5 in | Wt 188.4 lb

## 2014-06-16 DIAGNOSIS — R51 Headache: Secondary | ICD-10-CM

## 2014-06-16 DIAGNOSIS — R519 Headache, unspecified: Secondary | ICD-10-CM

## 2014-06-16 DIAGNOSIS — H02402 Unspecified ptosis of left eyelid: Secondary | ICD-10-CM

## 2014-06-16 NOTE — Patient Instructions (Signed)
I will check additional testing. 

## 2014-06-16 NOTE — Progress Notes (Signed)
[facility]  PATIENT: [name] DOB: July 14, 1946  REFERRING CLINICIAN: Zaldivar HISTORY FROM: patient  REASON FOR VISIT: follow up   HISTORICAL  CHIEF COMPLAINT:  Chief Complaint  Patient presents with  . Follow-up    HISTORY OF PRESENT ILLNESS:   UPDATE 06/16/14: Since last visit, doing well. No new events. Left ptosis, left sided headaches, left scalp numbness stable. These come independently. Labs and MRIs reviewed.  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 Patient and his coworkers and family have noticed intermittent left eyelid drooping, typically in the afternoon around 2 to 3:00. Symptoms started in 2011. Symptoms have fluctuated on a day-to-day basis. Sometimes his left eyelid is drooping so much that it is completely shut. He denies any double vision, blurred vision, swallowing problems, slurred speech, breathing problems, generalized muscle weakness. No neck weakness. Patient was referred to oculoplastic surgeon for ptosis repair, who then referred patient to me to rule out any other neurologic cause. Patient's left ptosis seems to be worse when he is having headache on left side. Patient continues to have developed aching left occipital, temporal, frontal, periauricular pain sometimes with numbness.  REVIEW OF SYSTEMS: Full 14 system review of systems performed and notable only for runny nose cough apnea snoring headache headache numbness.  ALLERGIES: No Known Allergies  HOME MEDICATIONS: Outpatient Prescriptions Prior to Visit  Medication Sig Dispense Refill  . aspirin 325 MG tablet Take 325 mg by mouth daily.        Marland Kitchen atorvastatin (LIPITOR) 10 MG tablet Take 10 mg by mouth daily.        . cholecalciferol (VITAMIN D) 1000 UNITS  tablet Take 1,000 Units by mouth daily.        Mariane Baumgarten Calcium (STOOL SOFTENER PO) Take by mouth daily.        . valsartan-hydrochlorothiazide (DIOVAN-HCT) 160-12.5 MG per tablet Take 1 tablet by mouth daily.         No facility-administered medications prior to visit.    PAST MEDICAL HISTORY: Past Medical History  Diagnosis Date  . Hypertension   . Hyperlipidemia   . Hemorrhoids   . Anal fissure     PAST SURGICAL HISTORY: Past Surgical History  Procedure Laterality Date  . Rotator cuff repair  2010    left  . Anal fissure repair  05/11/11    FAMILY HISTORY: Family History  Problem Relation Age of Onset  . Heart disease Mother   . Cancer Father     stomach  . Cancer Paternal Uncle     prostate    SOCIAL HISTORY:  History   Social History  . Marital Status: Married    Spouse Name: N/A    Number of Children: 1  . Years of Education: masters   Occupational History  .  [employer]   Social History Main Topics  . Smoking status: Former Research scientist (life sciences)  . Smokeless tobacco: Not on file  . Alcohol Use: No  . Drug Use: No  . Sexual Activity: Not on file   Other Topics Concern  . Not on file   Social History Narrative   Patient lives at home with his wife.     PHYSICAL EXAM  Filed Vitals:   06/16/14 1409  BP: 125/80  Pulse: 80  Temp: 98.4 F (36.9 C)  TempSrc: Oral  Height: 5'  5.5" (1.664 m)  Weight: 188 lb 6.4 oz (85.458 kg)    Not recorded    Body mass index is 30.86 kg/(m^2).  GENERAL EXAM: Patient is in no distress; well developed, nourished and groomed; neck is supple; NECK EXT/FLEX 5/5.  CARDIOVASCULAR: Regular rate and rhythm, no murmurs, no carotid bruits  NEUROLOGIC: MENTAL STATUS: awake, alert, oriented to person, place and time, recent and remote memory intact, normal attention and concentration, language fluent, comprehension intact, naming intact, fund of knowledge appropriate CRANIAL NERVE: no papilledema on fundoscopic exam,  pupils equal and reactive to light, visual fields full to confrontation, extraocular muscles intact, no nystagmus, facial sensation and strength symmetric, MINIMAL LEFT PTOSIS, hearing intact, palate elevates symmetrically, uvula midline, shoulder shrug symmetric, tongue midline.  MOTOR: normal bulk and tone, full strength in the BUE, BLE SENSORY: normal and symmetric to light touch  COORDINATION: finger-nose-finger, fine finger movements normal REFLEXES: deep tendon reflexes present and symmetric GAIT/STATION: narrow based gait; able to walk on toes, heels and tandem; romberg is negative    DIAGNOSTIC DATA (LABS, IMAGING, TESTING) - I reviewed patient records, labs, notes, testing and imaging myself where available.  Lab Results  Component Value Date   WBC 6.6 05/08/2011   HGB 12.8* 05/08/2011   HCT 38.8* 05/08/2011   MCV 82.2 05/08/2011   PLT 182 05/08/2011      Component Value Date/Time   NA 137 05/08/2011 0905   K 4.0 05/08/2011 0905   CL 100 05/08/2011 0905   CO2 29 05/08/2011 0905   GLUCOSE 97 05/08/2011 0905   BUN 16 05/08/2011 0905   CREATININE 0.97 05/08/2011 0905   CALCIUM 9.4 05/08/2011 0905   PROT 7.6 05/08/2011 0905   ALBUMIN 3.8 05/08/2011 0905   AST 19 05/08/2011 0905   ALT 19 05/08/2011 0905   ALKPHOS 61 05/08/2011 0905   BILITOT 0.6 05/08/2011 0905   GFRNONAA >60 05/08/2011 0905   GFRAA >60 05/08/2011 0905   No results found for this basename: CHOL,  HDL,  LDLCALC,  LDLDIRECT,  TRIG,  CHOLHDL   No results found for this basename: HGBA1C   No results found for this basename: VITAMINB12   Lab Results  Component Value Date   TSH 1.060 12/15/2013   Total CK  Date Value Ref Range Status  12/15/2013 268* 24 - 204 U/L Final   Aldolase  Date Value Ref Range Status  12/15/2013 5.4  3.3 - 10.3 U/L Final   AChR Binding Ab, Serum  Date Value Ref Range Status  12/15/2013 0.08  0.00 - 0.24 nmol/L Final                                    Negative:   0.00 - 0.24                                     Borderline: 0.25 - 0.40                                    Positive:        > 0.40    I reviewed images myself and agree with interpretation. -VRP  12/24/13 MRI brain 1. Mild scattered periventricular and subcortical foci of non-specific T2 hyperintensities. These findings are non-specific and  considerations include autoimmune, inflammatory, post-infectious, microvascular ischemic or migraine associated etiologies.  2. No abnormal enhancing lesions.  12/24/13 MRA head - normal  ASSESSMENT AND PLAN  68 y.o. year old male here with intermittent, fluctuating left-sided ptosis since 2011, sometimes associated with increasing left-sided headaches. Workup so far unremarkable, except for borderline high CK.  Ddx: ocular myasthenia, age related ptosis, myopathy  PLAN: - repeat labs, including anti-MUSK - monitor symptoms  Orders Placed This Encounter  Procedures  . CK  . Aldolase  . Other/Misc lab test   Return if symptoms worsen or fail to improve.    Penni Bombard, MD 09/47/0962, 8:36 PM Certified in Neurology, Neurophysiology and Neuroimaging  [facility] [address], [place], [place] [address] [phone]

## 2014-06-22 ENCOUNTER — Telehealth: Payer: Self-pay | Admitting: Diagnostic Neuroimaging

## 2014-06-22 NOTE — Telephone Encounter (Signed)
Amanda from [facility] calling to request the 6 digit code for the miscellaneous test that was ordered, please return her call and advise.

## 2014-06-25 NOTE — Telephone Encounter (Signed)
I called pt and left message on VM. CK still high, slightly higher. Will follow up anti-MUSK. Pls setup follow up in 3 months with me. -VRP

## 2014-06-28 NOTE — Telephone Encounter (Signed)
Spoke to patient. Scheduled 3 month f/u. Will f/u on anti-musk and other labs.

## 2014-06-29 ENCOUNTER — Telehealth: Payer: Self-pay

## 2014-06-29 LAB — OTHER LAB TEST

## 2014-06-29 NOTE — Telephone Encounter (Signed)
Spoke to patient yesterday. Gave results and scheduled follow up

## 2014-06-29 NOTE — Telephone Encounter (Signed)
Amanda from [facility] calling again to request the 6 digit code for the miscellaneous test that was ordered, please return her call and advise.

## 2014-06-29 NOTE — Telephone Encounter (Signed)
Spoke to [name]. Advised would have to speak with lab tech to figure out test codes. [name] agreed.

## 2014-06-29 NOTE — Telephone Encounter (Signed)
[name] can be reached at [phone], option 1, extension [phone].

## 2014-06-29 NOTE — Telephone Encounter (Signed)
-----   Message from Penni Bombard, MD sent at 06/25/2014  6:28 PM EST ----- CK still high. Needs 3 month clinic and lab follow up. -VRP

## 2014-06-29 NOTE — Telephone Encounter (Addendum)
Spoke with lab tech. Will need to speak with Dr. Leta Baptist when he returns next week. Notified Amanda.

## 2014-07-03 LAB — ALDOLASE: Aldolase: 6.3 U/L (ref 3.3–10.3)

## 2014-07-03 LAB — CK: CK TOTAL: 364 U/L — AB (ref 24–204)

## 2014-07-08 NOTE — Telephone Encounter (Signed)
Spoke with Dr. Leta Baptist. Will order through [facility].

## 2014-08-03 ENCOUNTER — Telehealth: Payer: Self-pay

## 2014-08-03 NOTE — Telephone Encounter (Signed)
Called patient about having [facility] lab. Patient requested to wait until after the holidays. He would like for me to call him at the beginning of the year.

## 2014-09-30 ENCOUNTER — Encounter: Payer: Self-pay | Admitting: Diagnostic Neuroimaging

## 2014-09-30 ENCOUNTER — Ambulatory Visit (INDEPENDENT_AMBULATORY_CARE_PROVIDER_SITE_OTHER): Payer: 59 | Admitting: Diagnostic Neuroimaging

## 2014-09-30 VITALS — BP 122/76 | HR 66 | Ht 60.0 in | Wt 189.0 lb

## 2014-09-30 DIAGNOSIS — H02402 Unspecified ptosis of left eyelid: Secondary | ICD-10-CM

## 2014-09-30 DIAGNOSIS — R748 Abnormal levels of other serum enzymes: Secondary | ICD-10-CM

## 2014-09-30 NOTE — Patient Instructions (Signed)
Monitor symptoms. I will check additional testing.

## 2014-09-30 NOTE — Progress Notes (Signed)
[facility]  PATIENT: [name] DOB: [birth year]  REFERRING CLINICIAN: Zaldivar HISTORY FROM: patient  REASON FOR VISIT: follow up   HISTORICAL  CHIEF COMPLAINT:  Chief Complaint  Patient presents with  . Follow-up    ptosis    HISTORY OF PRESENT ILLNESS:   UPDATE 09/30/14: Since last visit, symptoms are stable. Notes some intermittent left hand cramps and left foot dragging after significant exertion. Noted some left hand cramps with left arm BP check.   UPDATE 06/16/14: Since last visit, doing well. No new events. Left ptosis, left sided headaches, left scalp numbness stable. These come independently. Labs and MRIs reviewed.  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 Patient and his coworkers and family have noticed intermittent left eyelid drooping, typically in the afternoon around 2 to 3:00. Symptoms started in 2011. Symptoms have fluctuated on a day-to-day basis. Sometimes his left eyelid is drooping so much that it is completely shut. He denies any double vision, blurred vision, swallowing problems, slurred speech, breathing problems, generalized muscle weakness. No neck weakness. Patient was referred to oculoplastic surgeon for ptosis repair, who then referred patient to me to rule out any other neurologic cause. Patient's left ptosis seems to be worse when he is having headache on left side. Patient continues to have developed aching left occipital, temporal, frontal, periauricular pain sometimes with numbness.  REVIEW OF SYSTEMS: Full 14 system review of systems performed and notable only for runny nose cough apnea snoring headache headache numbness.  ALLERGIES: No Known Allergies  HOME MEDICATIONS: Outpatient Prescriptions Prior to  Visit  Medication Sig Dispense Refill  . aspirin 325 MG tablet Take 325 mg by mouth daily.      Marland Kitchen atorvastatin (LIPITOR) 10 MG tablet Take 10 mg by mouth daily.      . cholecalciferol (VITAMIN D) 1000 UNITS tablet Take 1,000 Units by mouth daily.      Mariane Baumgarten Calcium (STOOL SOFTENER PO) Take by mouth daily.      . valsartan-hydrochlorothiazide (DIOVAN-HCT) 160-12.5 MG per tablet Take 1 tablet by mouth daily.      . pseudoephedrine-guaifenesin (MUCINEX D) 60-600 MG per tablet Take 1 tablet by mouth every 12 (twelve) hours.    . predniSONE (STERAPRED UNI-PAK) 5 MG TABS tablet Take by mouth daily.     No facility-administered medications prior to visit.    PAST MEDICAL HISTORY: Past Medical History  Diagnosis Date  . Hypertension   . Hyperlipidemia   . Hemorrhoids   . Anal fissure     PAST SURGICAL HISTORY: Past Surgical History  Procedure Laterality Date  . Rotator cuff repair  2010    left  . Anal fissure repair  05/11/11    FAMILY HISTORY: Family History  Problem Relation Age of Onset  . Heart disease Mother   . Cancer Father     stomach  . Cancer Paternal Uncle     prostate    SOCIAL HISTORY:  History   Social History  . Marital Status: Married    Spouse Name: N/A  . Number of Children: 1  . Years of Education: masters   Occupational History  .  [employer]   Social History Main Topics  . Smoking status: Former Research scientist (life sciences)  . Smokeless tobacco: Not on file  . Alcohol Use: No  . Drug Use: No  .  Sexual Activity: Not on file   Other Topics Concern  . Not on file   Social History Narrative   Patient lives at home with his wife.     PHYSICAL EXAM  Filed Vitals:   09/30/14 1312  BP: 122/76  Pulse: 66  Height: 5' (1.524 m)  Weight: 189 lb (85.73 kg)    Not recorded      Body mass index is 36.91 kg/(m^2).  GENERAL EXAM: Patient is in no distress; well developed, nourished and groomed; neck is supple; NECK EXT/FLEX  5/5.  CARDIOVASCULAR: Regular rate and rhythm, no murmurs, no carotid bruits  NEUROLOGIC: MENTAL STATUS: awake, alert, Language fluent, comprehension intact, naming intact, fund of knowledge appropriate CRANIAL NERVE: no papilledema on fundoscopic exam, pupils equal and reactive to light, visual fields full to confrontation, extraocular muscles intact, no nystagmus, facial sensation and strength symmetric, MILD LEFT PTOSIS, hearing intact, palate elevates symmetrically, uvula midline, shoulder shrug symmetric, tongue midline.  MOTOR: normal bulk and tone, full strength in the BUE, BLE; MILD LEFT HAND CRAMP / POOR RELAXATION AFTER LEFT HAND GRIP TESTING SENSORY: normal and symmetric to light touch  COORDINATION: finger-nose-finger, fine finger movements normal REFLEXES: deep tendon reflexes present and symmetric GAIT/STATION: narrow based gait; able to walk on toes, heels and tandem; romberg is negative    DIAGNOSTIC DATA (LABS, IMAGING, TESTING) - I reviewed patient records, labs, notes, testing and imaging myself where available.  Lab Results  Component Value Date   WBC 6.6 05/08/2011   HGB 12.8* 05/08/2011   HCT 38.8* 05/08/2011   MCV 82.2 05/08/2011   PLT 182 05/08/2011      Component Value Date/Time   NA 137 05/08/2011 0905   K 4.0 05/08/2011 0905   CL 100 05/08/2011 0905   CO2 29 05/08/2011 0905   GLUCOSE 97 05/08/2011 0905   BUN 16 05/08/2011 0905   CREATININE 0.97 05/08/2011 0905   CALCIUM 9.4 05/08/2011 0905   PROT 7.6 05/08/2011 0905   ALBUMIN 3.8 05/08/2011 0905   AST 19 05/08/2011 0905   ALT 19 05/08/2011 0905   ALKPHOS 61 05/08/2011 0905   BILITOT 0.6 05/08/2011 0905   GFRNONAA >60 05/08/2011 0905   GFRAA >60 05/08/2011 0905   No results found for: CHOL No results found for: HGBA1C No results found for: [id number] Lab Results  Component Value Date   TSH 1.060 12/15/2013   TOTAL CK  Date Value Ref Range Status  06/16/2014 364* 24 - 204 U/L Final   12/15/2013 268* 24 - 204 U/L Final   ALDOLASE  Date Value Ref Range Status  06/16/2014 6.3 3.3 - 10.3 U/L Final  12/15/2013 5.4 3.3 - 10.3 U/L Final   ACHR BINDING AB, SERUM  Date Value Ref Range Status  12/15/2013 0.08 0.00 - 0.24 nmol/L Final    Comment:                                   Negative:   0.00 - 0.24                                Borderline: 0.25 - 0.40                                Positive:        >  0.40    I reviewed images myself and agree with interpretation. -VRP  12/24/13 MRI brain 1. Mild scattered periventricular and subcortical foci of non-specific T2 hyperintensities. These findings are non-specific and considerations include autoimmune, inflammatory, post-infectious, microvascular ischemic or migraine associated etiologies.  2. No abnormal enhancing lesions.  12/24/13 MRA head - normal  ASSESSMENT AND PLAN  69 y.o. year old male here with intermittent, fluctuating left-sided ptosis since 2011, sometimes associated with increasing left-sided headaches. Workup so far unremarkable, except for borderline high CK.  Ddx: ocular myasthenia, age related ptosis, chronic progressive external ophthalmoplegia, myotonic dystrophy  PLAN: - check CK, aldolase, anti-MUSK - may consider EMG/NCS with repetitive stimulation at next visit - monitor symptoms   Orders Placed This Encounter  Procedures  . CK  . Aldolase  . Other/Misc lab test   Return in about 3 months (around 12/29/2014).    Penni Bombard, MD 2/53/6644, 0:34 PM Certified in Neurology, Neurophysiology and Neuroimaging  [facility] [address] [place], [place] [address] [phone]

## 2014-10-03 LAB — ALDOLASE: ALDOLASE: 6.9 U/L (ref 3.3–10.3)

## 2014-10-03 LAB — CK: Total CK: 327 U/L — ABNORMAL HIGH (ref 24–204)

## 2014-11-01 ENCOUNTER — Other Ambulatory Visit: Payer: Self-pay | Admitting: Internal Medicine

## 2014-11-01 DIAGNOSIS — R198 Other specified symptoms and signs involving the digestive system and abdomen: Secondary | ICD-10-CM

## 2014-11-01 DIAGNOSIS — I714 Abdominal aortic aneurysm, without rupture, unspecified: Secondary | ICD-10-CM

## 2014-11-05 ENCOUNTER — Other Ambulatory Visit: Payer: Self-pay

## 2014-11-15 ENCOUNTER — Ambulatory Visit
Admission: RE | Admit: 2014-11-15 | Discharge: 2014-11-15 | Disposition: A | Discharge status: Home / Self Care | Payer: 59 | Source: Ambulatory Visit | Attending: Internal Medicine | Admitting: Internal Medicine

## 2014-11-15 DIAGNOSIS — I714 Abdominal aortic aneurysm, without rupture, unspecified: Secondary | ICD-10-CM

## 2014-11-15 DIAGNOSIS — R198 Other specified symptoms and signs involving the digestive system and abdomen: Secondary | ICD-10-CM

## 2014-11-29 ENCOUNTER — Encounter: Payer: Self-pay | Admitting: Diagnostic Neuroimaging

## 2014-12-03 NOTE — Progress Notes (Signed)
HPI: 69 year old male for evaluation of palpitations, chest pain and abnormal electrocardiogram. Abdominal ultrasound in March 2016 showed no aneurysm. Patient describes dyspnea at times that is more seasonal. There are times when he can exercise without dyspnea and other times when he feels dyspneic with activities and at rest. No orthopnea, PND or pedal edema. He occasionally feels a brief sharp chest pain for 1-2 seconds and does not radiate, is not exertional with no associated symptoms. He also feels an elevated heart rate with activities.  Current Outpatient Prescriptions  Medication Sig Dispense Refill  . aspirin 325 MG tablet Take 325 mg by mouth daily.      Marland Kitchen atorvastatin (LIPITOR) 10 MG tablet Take 10 mg by mouth daily.      Marland Kitchen azelastine (ASTELIN) 0.1 % nasal spray Place 137 mcg into the nose as needed.    . cholecalciferol (VITAMIN D) 1000 UNITS tablet Take 1,000 Units by mouth daily.      Mariane Baumgarten Calcium (STOOL SOFTENER PO) Take by mouth daily.      Marland Kitchen omeprazole (PRILOSEC) 20 MG capsule Take 20 mg by mouth daily.    . pseudoephedrine-guaifenesin (MUCINEX D) 60-600 MG per tablet Take 1 tablet by mouth every 12 (twelve) hours.    . valsartan-hydrochlorothiazide (DIOVAN-HCT) 160-12.5 MG per tablet Take 1 tablet by mouth daily.       No current facility-administered medications for this visit.    No Known Allergies   Past Medical History  Diagnosis Date  . Hypertension   . Hyperlipidemia   . Hemorrhoids   . Anal fissure   . GERD (gastroesophageal reflux disease)   . CVA (cerebral infarction)     Past Surgical History  Procedure Laterality Date  . Rotator cuff repair  2010    left  . Anal fissure repair  05/11/11    History   Social History  . Marital Status: Married    Spouse Name: N/A  . Number of Children: 1  . Years of Education: masters   Occupational History  .  [employer]   Social History Main Topics  . Smoking status: Former Research scientist (life sciences)  .  Smokeless tobacco: Not on file  . Alcohol Use: No  . Drug Use: No  . Sexual Activity: Not on file   Other Topics Concern  . Not on file   Social History Narrative   Patient lives at home with his wife.    Family History  Problem Relation Age of Onset  . Heart disease Mother     Died of MI at age 56  . Cancer Father     stomach  . Cancer Paternal Uncle     prostate    ROS: no fevers or chills, productive cough, hemoptysis, dysphasia, odynophagia, melena, hematochezia, dysuria, hematuria, rash, seizure activity, orthopnea, PND, pedal edema, claudication. Remaining systems are negative.  Physical Exam:   Blood pressure 140/82, pulse 59, height 5\' 5"  (1.651 m), weight 189 lb 14.4 oz (86.138 kg).  General:  Well developed/well nourished in NAD Skin warm/dry Patient not depressed No peripheral clubbing Back-normal HEENT-normal/normal eyelids Neck supple/normal carotid upstroke bilaterally; no bruits; no JVD; no thyromegaly chest - CTA/ normal expansion CV - RRR/normal S1 and S2; no murmurs, rubs or gallops;  PMI nondisplaced Abdomen -NT/ND, no HSM, no mass, + bowel sounds, no bruit 2+ femoral pulses, no bruits Ext-no edema, chords, 2+ DP Neuro-grossly nonfocal  ECG  12/01/2014 showed sinus rhythm, first-degree AV block, no ST changes.  Electrocardiogram today shows sinus rhythm, first-degree AV block and no ST changes.

## 2014-12-06 ENCOUNTER — Encounter: Payer: Self-pay | Admitting: *Deleted

## 2014-12-06 ENCOUNTER — Encounter: Payer: Self-pay | Admitting: Cardiology

## 2014-12-06 ENCOUNTER — Ambulatory Visit (INDEPENDENT_AMBULATORY_CARE_PROVIDER_SITE_OTHER): Payer: 59 | Admitting: Cardiology

## 2014-12-06 VITALS — BP 140/82 | HR 59 | Ht 65.0 in | Wt 189.9 lb

## 2014-12-06 DIAGNOSIS — R079 Chest pain, unspecified: Secondary | ICD-10-CM | POA: Insufficient documentation

## 2014-12-06 DIAGNOSIS — R002 Palpitations: Secondary | ICD-10-CM | POA: Diagnosis not present

## 2014-12-06 DIAGNOSIS — R0600 Dyspnea, unspecified: Secondary | ICD-10-CM | POA: Diagnosis not present

## 2014-12-06 DIAGNOSIS — E785 Hyperlipidemia, unspecified: Secondary | ICD-10-CM | POA: Insufficient documentation

## 2014-12-06 DIAGNOSIS — I10 Essential (primary) hypertension: Secondary | ICD-10-CM

## 2014-12-06 DIAGNOSIS — R072 Precordial pain: Secondary | ICD-10-CM

## 2014-12-06 NOTE — Assessment & Plan Note (Signed)
Echocardiogram to assess LV function. 

## 2014-12-06 NOTE — Assessment & Plan Note (Signed)
Schedule exercise treadmill for risk stratification. 

## 2014-12-06 NOTE — Patient Instructions (Signed)
Your physician recommends that you schedule a follow-up appointment in: [place] has requested that you have an exercise tolerance test. For further information please visit [email or web address]. Please also follow instruction sheet, as given.   Your physician has requested that you have an echocardiogram. Echocardiography is a painless test that uses sound waves to create images of your heart. It provides your doctor with information about the size and shape of your heart and how well your heart's chambers and valves are working. This procedure takes approximately one hour. There are no restrictions for this procedure.   Your physician has recommended that you wear a 48 HOUR holter monitor. Holter monitors are medical devices that record the heart's electrical activity. Doctors most often use these monitors to diagnose arrhythmias. Arrhythmias are problems with the speed or rhythm of the heartbeat. The monitor is a small, portable device. You can wear one while you do your normal daily activities. This is usually used to diagnose what is causing palpitations/syncope (passing out).

## 2014-12-06 NOTE — Assessment & Plan Note (Signed)
Continue present blood pressure medications. 

## 2014-12-06 NOTE — Assessment & Plan Note (Signed)
Patient has these with activities on a daily basis. Schedule 48 hour Holter monitor.

## 2014-12-06 NOTE — Assessment & Plan Note (Signed)
Continue statin. 

## 2014-12-08 ENCOUNTER — Encounter (HOSPITAL_COMMUNITY): Payer: Self-pay | Admitting: *Deleted

## 2014-12-13 ENCOUNTER — Encounter: Payer: Self-pay | Admitting: *Deleted

## 2014-12-13 ENCOUNTER — Encounter (INDEPENDENT_AMBULATORY_CARE_PROVIDER_SITE_OTHER): Payer: 59

## 2014-12-13 DIAGNOSIS — R002 Palpitations: Secondary | ICD-10-CM | POA: Diagnosis not present

## 2014-12-13 NOTE — Progress Notes (Signed)
Patient ID: [name], male   DOB: 1946/03/29, 69 y.o.   MRN: [id number] [facility] 48 hour holter monitor applied to patient.

## 2014-12-20 ENCOUNTER — Telehealth: Payer: Self-pay | Admitting: *Deleted

## 2014-12-22 ENCOUNTER — Telehealth: Payer: Self-pay | Admitting: *Deleted

## 2014-12-22 NOTE — Telephone Encounter (Signed)
Monitor reviewed by dr Stanford Breed shows sinus with pac's (some non conducted pac's) and pvc's. Left message for pt to call

## 2014-12-22 NOTE — Telephone Encounter (Signed)
pt aware of results  

## 2014-12-29 ENCOUNTER — Telehealth (HOSPITAL_COMMUNITY): Payer: Self-pay

## 2014-12-29 NOTE — Telephone Encounter (Signed)
Encounter complete. 

## 2014-12-30 ENCOUNTER — Telehealth (HOSPITAL_COMMUNITY): Payer: Self-pay

## 2014-12-30 NOTE — Telephone Encounter (Signed)
Encounter complete. 

## 2014-12-31 ENCOUNTER — Ambulatory Visit (HOSPITAL_COMMUNITY)
Admission: RE | Admit: 2014-12-31 | Discharge: 2014-12-31 | Disposition: A | Discharge status: Home / Self Care | Payer: 59 | Source: Ambulatory Visit | Attending: Cardiovascular Disease | Admitting: Cardiovascular Disease

## 2014-12-31 ENCOUNTER — Other Ambulatory Visit: Payer: Self-pay | Admitting: Cardiology

## 2014-12-31 ENCOUNTER — Ambulatory Visit (HOSPITAL_BASED_OUTPATIENT_CLINIC_OR_DEPARTMENT_OTHER)
Admission: RE | Admit: 2014-12-31 | Discharge: 2014-12-31 | Disposition: A | Discharge status: Home / Self Care | Payer: 59 | Source: Ambulatory Visit | Attending: Cardiovascular Disease | Admitting: Cardiovascular Disease

## 2014-12-31 DIAGNOSIS — Z8249 Family history of ischemic heart disease and other diseases of the circulatory system: Secondary | ICD-10-CM | POA: Diagnosis not present

## 2014-12-31 DIAGNOSIS — E785 Hyperlipidemia, unspecified: Secondary | ICD-10-CM | POA: Diagnosis not present

## 2014-12-31 DIAGNOSIS — R0600 Dyspnea, unspecified: Secondary | ICD-10-CM

## 2014-12-31 DIAGNOSIS — I10 Essential (primary) hypertension: Secondary | ICD-10-CM | POA: Diagnosis not present

## 2014-12-31 DIAGNOSIS — Z87891 Personal history of nicotine dependence: Secondary | ICD-10-CM | POA: Insufficient documentation

## 2014-12-31 LAB — EXERCISE TOLERANCE TEST
CHL CUP MPHR: 153 {beats}/min
CHL CUP RESTING HR STRESS: 63 {beats}/min
CHL CUP STRESS STAGE 1 DBP: 91 mmHg
CHL CUP STRESS STAGE 1 HR: 70 {beats}/min
CHL CUP STRESS STAGE 2 HR: 70 {beats}/min
CHL CUP STRESS STAGE 4 DBP: 85 mmHg
CHL CUP STRESS STAGE 4 SBP: 150 mmHg
CHL CUP STRESS STAGE 4 SPEED: 1.7 mph
CHL CUP STRESS STAGE 5 DBP: 94 mmHg
CHL CUP STRESS STAGE 5 SBP: 143 mmHg
CHL CUP STRESS STAGE 6 GRADE: 14 %
CHL CUP STRESS STAGE 7 HR: 113 {beats}/min
CHL CUP STRESS STAGE 7 SBP: 208 mmHg
CHL CUP STRESS STAGE 7 SPEED: 0 mph
CHL CUP STRESS STAGE 8 DBP: 84 mmHg
CHL CUP STRESS STAGE 8 GRADE: 0 %
CSEPHR: 92 %
CSEPPHR: 141 {beats}/min
Estimated workload: 10.1 METS
Estimated workload: 10.1 METS
Exercise duration (min): 8 min
Peak HR: 141 {beats}/min
Percent of predicted max HR: 92 %
RPE: 17875
Stage 1 Grade: 0 %
Stage 1 SBP: 155 mmHg
Stage 1 Speed: 0 mph
Stage 2 Grade: 0 %
Stage 2 Speed: 1 mph
Stage 3 Grade: 0 %
Stage 3 HR: 69 {beats}/min
Stage 3 Speed: 1 mph
Stage 4 Grade: 10 %
Stage 4 HR: 100 {beats}/min
Stage 5 Grade: 12 %
Stage 5 HR: 125 {beats}/min
Stage 5 Speed: 2.5 mph
Stage 6 HR: 141 {beats}/min
Stage 6 Speed: 3.4 mph
Stage 7 DBP: 79 mmHg
Stage 7 Grade: 0 %
Stage 8 HR: 78 {beats}/min
Stage 8 SBP: 155 mmHg
Stage 8 Speed: 0 mph

## 2015-01-04 ENCOUNTER — Encounter: Payer: Self-pay | Admitting: Cardiology

## 2015-01-04 NOTE — Telephone Encounter (Signed)
This encounter was created in error - please disregard.

## 2015-01-04 NOTE — Telephone Encounter (Signed)
Pt is returning your call. Thanks

## 2015-01-06 ENCOUNTER — Ambulatory Visit: Payer: 59 | Admitting: Diagnostic Neuroimaging

## 2015-03-02 NOTE — Progress Notes (Signed)
      HPI: FU palpitations, chest pain and abnormal electrocardiogram. Abdominal ultrasound in March 2016 showed no aneurysm. Holter monitor April 2016 showed sinus with PACs, nonconducted PACs and PVCs. Echocardiogram May 2016 showed normal LV function and grade 1 diastolic dysfunction. Exercise treadmill May 2016 negative adequate. Since he was last seen, the patient denies any dyspnea on exertion, orthopnea, PND, pedal edema, palpitations, syncope or chest pain.   Current Outpatient Prescriptions  Medication Sig Dispense Refill  . aspirin 325 MG tablet Take 325 mg by mouth daily.      Marland Kitchen atorvastatin (LIPITOR) 10 MG tablet Take 10 mg by mouth daily.      Marland Kitchen azelastine (ASTELIN) 0.1 % nasal spray Place 137 mcg into the nose as needed.    . cholecalciferol (VITAMIN D) 1000 UNITS tablet Take 1,000 Units by mouth daily.      Mariane Baumgarten Calcium (STOOL SOFTENER PO) Take by mouth daily.      Marland Kitchen omeprazole (PRILOSEC) 20 MG capsule Take 20 mg by mouth daily.    . pseudoephedrine-guaifenesin (MUCINEX D) 60-600 MG per tablet Take 1 tablet by mouth every 12 (twelve) hours.    . valsartan-hydrochlorothiazide (DIOVAN-HCT) 160-12.5 MG per tablet Take 1 tablet by mouth daily.       No current facility-administered medications for this visit.     Past Medical History  Diagnosis Date  . Hypertension   . Hyperlipidemia   . Hemorrhoids   . Anal fissure   . GERD (gastroesophageal reflux disease)   . CVA (cerebral infarction)     Past Surgical History  Procedure Laterality Date  . Rotator cuff repair  2010    left  . Anal fissure repair  05/11/11    History   Social History  . Marital Status: Married    Spouse Name: N/A  . Number of Children: 1  . Years of Education: masters   Occupational History  .  [employer]   Social History Main Topics  . Smoking status: Former Research scientist (life sciences)  . Smokeless tobacco: Not on file  . Alcohol Use: No  . Drug Use: No  . Sexual Activity: Not on file   Other  Topics Concern  . Not on file   Social History Narrative   Patient lives at home with his wife.    ROS: no fevers or chills, productive cough, hemoptysis, dysphasia, odynophagia, melena, hematochezia, dysuria, hematuria, rash, seizure activity, orthopnea, PND, pedal edema, claudication. Remaining systems are negative.  Physical Exam: Well-developed well-nourished in no acute distress.  Skin is warm and dry.  HEENT is normal.  Neck is supple.  Chest is clear to auscultation with normal expansion.  Cardiovascular exam is regular rate and rhythm.  Abdominal exam nontender or distended. No masses palpated. Extremities show no edema. neuro grossly intact

## 2015-03-07 ENCOUNTER — Ambulatory Visit (INDEPENDENT_AMBULATORY_CARE_PROVIDER_SITE_OTHER): Payer: 59 | Admitting: Cardiology

## 2015-03-07 ENCOUNTER — Encounter: Payer: Self-pay | Admitting: Cardiology

## 2015-03-07 VITALS — BP 137/84 | HR 65 | Ht 65.0 in | Wt 188.1 lb

## 2015-03-07 DIAGNOSIS — I10 Essential (primary) hypertension: Secondary | ICD-10-CM

## 2015-03-07 DIAGNOSIS — R072 Precordial pain: Secondary | ICD-10-CM | POA: Diagnosis not present

## 2015-03-07 DIAGNOSIS — R002 Palpitations: Secondary | ICD-10-CM | POA: Diagnosis not present

## 2015-03-07 DIAGNOSIS — E785 Hyperlipidemia, unspecified: Secondary | ICD-10-CM

## 2015-03-07 NOTE — Assessment & Plan Note (Signed)
Blood pressure controlled. Continue present medications. 

## 2015-03-07 NOTE — Patient Instructions (Signed)
NO CHANGE WITH CURRENT MEDICATION  Your physician recommends that you schedule a follow-up appointment ON AN AS NEEDED BASIS.

## 2015-03-07 NOTE — Assessment & Plan Note (Signed)
Symptoms have resolved.exercise treadmill negative. We will not pursue further cardiac evaluation.

## 2015-03-07 NOTE — Assessment & Plan Note (Signed)
Monitor shows PACs and PVCs. Symptoms have resolved. No further evaluation.

## 2015-03-07 NOTE — Assessment & Plan Note (Signed)
Continue statin. 

## 2015-12-28 ENCOUNTER — Other Ambulatory Visit: Payer: Self-pay | Admitting: Gastroenterology

## 2016-01-26 ENCOUNTER — Encounter (HOSPITAL_COMMUNITY): Payer: Self-pay | Admitting: *Deleted

## 2016-02-06 ENCOUNTER — Encounter (HOSPITAL_COMMUNITY)
Admission: RE | Disposition: A | Discharge status: Home / Self Care | Payer: Self-pay | Source: Ambulatory Visit | Attending: Gastroenterology

## 2016-02-06 ENCOUNTER — Ambulatory Visit (HOSPITAL_COMMUNITY)
Admission: RE | Admit: 2016-02-06 | Discharge: 2016-02-06 | Disposition: A | Discharge status: Home / Self Care | Payer: 59 | Source: Ambulatory Visit | Attending: Gastroenterology | Admitting: Gastroenterology

## 2016-02-06 ENCOUNTER — Encounter (HOSPITAL_COMMUNITY): Payer: Self-pay | Admitting: *Deleted

## 2016-02-06 ENCOUNTER — Ambulatory Visit (HOSPITAL_COMMUNITY): Payer: 59 | Admitting: Anesthesiology

## 2016-02-06 DIAGNOSIS — D124 Benign neoplasm of descending colon: Secondary | ICD-10-CM | POA: Diagnosis not present

## 2016-02-06 DIAGNOSIS — Z87891 Personal history of nicotine dependence: Secondary | ICD-10-CM | POA: Diagnosis not present

## 2016-02-06 DIAGNOSIS — M1990 Unspecified osteoarthritis, unspecified site: Secondary | ICD-10-CM | POA: Diagnosis not present

## 2016-02-06 DIAGNOSIS — Z1211 Encounter for screening for malignant neoplasm of colon: Secondary | ICD-10-CM | POA: Insufficient documentation

## 2016-02-06 DIAGNOSIS — Z8673 Personal history of transient ischemic attack (TIA), and cerebral infarction without residual deficits: Secondary | ICD-10-CM | POA: Insufficient documentation

## 2016-02-06 DIAGNOSIS — J45909 Unspecified asthma, uncomplicated: Secondary | ICD-10-CM | POA: Insufficient documentation

## 2016-02-06 DIAGNOSIS — N400 Enlarged prostate without lower urinary tract symptoms: Secondary | ICD-10-CM | POA: Diagnosis not present

## 2016-02-06 DIAGNOSIS — E7800 Pure hypercholesterolemia, unspecified: Secondary | ICD-10-CM | POA: Diagnosis not present

## 2016-02-06 DIAGNOSIS — Z800 Family history of malignant neoplasm of digestive organs: Secondary | ICD-10-CM | POA: Diagnosis not present

## 2016-02-06 DIAGNOSIS — K219 Gastro-esophageal reflux disease without esophagitis: Secondary | ICD-10-CM | POA: Diagnosis not present

## 2016-02-06 DIAGNOSIS — G4733 Obstructive sleep apnea (adult) (pediatric): Secondary | ICD-10-CM | POA: Insufficient documentation

## 2016-02-06 DIAGNOSIS — I10 Essential (primary) hypertension: Secondary | ICD-10-CM | POA: Diagnosis not present

## 2016-02-06 HISTORY — DX: Other specified disorders of nose and nasal sinuses: J34.89

## 2016-02-06 HISTORY — DX: Cerebral infarction, unspecified: I63.9

## 2016-02-06 HISTORY — DX: Unspecified osteoarthritis, unspecified site: M19.90

## 2016-02-06 HISTORY — PX: COLONOSCOPY WITH PROPOFOL: SHX5780

## 2016-02-06 HISTORY — DX: Post-traumatic stress disorder, unspecified: F43.10

## 2016-02-06 SURGERY — COLONOSCOPY WITH PROPOFOL
Anesthesia: Monitor Anesthesia Care

## 2016-02-06 MED ORDER — LIDOCAINE HCL (CARDIAC) 20 MG/ML IV SOLN
INTRAVENOUS | Status: AC
  Filled 2016-02-06: qty 5

## 2016-02-06 MED ORDER — PROPOFOL 500 MG/50ML IV EMUL
INTRAVENOUS | Status: DC | PRN
  Administered 2016-02-06: 140 ug/kg/min via INTRAVENOUS

## 2016-02-06 MED ORDER — LACTATED RINGERS IV SOLN
INTRAVENOUS | Status: DC
Start: 2016-02-06 — End: 2016-02-06
  Administered 2016-02-06: 11:00:00 via INTRAVENOUS
  Administered 2016-02-06: 1000 mL via INTRAVENOUS

## 2016-02-06 MED ORDER — SODIUM CHLORIDE 0.9 % IV SOLN: INTRAVENOUS | Status: DC

## 2016-02-06 MED ORDER — PROPOFOL 500 MG/50ML IV EMUL
INTRAVENOUS | Status: DC | PRN
  Administered 2016-02-06: 50 mg via INTRAVENOUS

## 2016-02-06 MED ORDER — PROPOFOL 10 MG/ML IV BOLUS
INTRAVENOUS | Status: AC
  Filled 2016-02-06: qty 40

## 2016-02-06 MED ORDER — PROPOFOL 10 MG/ML IV BOLUS
INTRAVENOUS | Status: AC
  Filled 2016-02-06: qty 20

## 2016-02-06 SURGICAL SUPPLY — 22 items

## 2016-02-06 NOTE — Op Note (Addendum)
[facility] Patient Name: [name] Procedure Date: 02/06/2016 MRN: [id number] Attending MD: Garlan Fair , MD Date of Birth: 11/28/1945 CSN: [id number] Age: 70 Admit Type: Outpatient Procedure:                Colonoscopy Indications:              High risk colon cancer surveillance: Personal                            history of adenoma less than 10 mm in size Providers:                Garlan Fair, MD, Malka So, RN, Cherylynn Ridges, Technician, Rosario Adie, CRNA Referring MD:              Medicines:                Propofol per Anesthesia Complications:            No immediate complications. Estimated Blood Loss:     Estimated blood loss: none. Procedure:                Pre-Anesthesia Assessment:                           - Prior to the procedure, a History and Physical                            was performed, and patient medications and                            allergies were reviewed. The patient's tolerance of                            previous anesthesia was also reviewed. The risks                            and benefits of the procedure and the sedation                            options and risks were discussed with the patient.                            All questions were answered, and informed consent                            was obtained. Prior Anticoagulants: The patient has                            taken aspirin, last dose was 1 day prior to                            procedure. ASA Grade Assessment: III - A patient  with severe systemic disease. After reviewing the                            risks and benefits, the patient was deemed in                            satisfactory condition to undergo the procedure.                           After obtaining informed consent, the colonoscope                            was passed under direct vision. Throughout the                     procedure, the patient's blood pressure, pulse, and                            oxygen saturations were monitored continuously. The                            [id number] scope was introduced through                            the anus and advanced to the the cecum, identified                            by appendiceal orifice and ileocecal valve. The                            colonoscopy was somewhat difficult due to                            significant looping. The patient tolerated the                            procedure well. The quality of the bowel                            preparation was good. The appendiceal orifice and                            the rectum were photographed. Scope In: 11:10:58 AM Scope Out: 11:59:28 AM Scope Withdrawal Time: 0 hours 30 minutes 9 seconds  Total Procedure Duration: 0 hours 48 minutes 30 seconds  Findings:      The perianal and digital rectal examinations were normal.      A 4 mm polyp was found in the proximal descending colon. The polyp was       sessile. The polyp was removed with a cold snare. Resection and       retrieval were complete.      The exam was otherwise without abnormality. Impression:               - One 4 mm polyp in the proximal descending colon,  removed with a cold snare. Resected and retrieved.                           - The examination was otherwise normal. Moderate Sedation:      N/A- Per Anesthesia Care Recommendation:           - Patient has a contact number available for                            emergencies. The signs and symptoms of potential                            delayed complications were discussed with the                            patient. Return to normal activities tomorrow.                            Written discharge instructions were provided to the                            patient.                           - Repeat colonoscopy in 5 years for  surveillance.                           - Resume previous diet.                           - Continue present medications. Procedure Code(s):        --- Professional ---                           [phone], Colonoscopy, flexible; with removal of                            tumor(s), polyp(s), or other lesion(s) by snare                            technique Diagnosis Code(s):        --- Professional ---                           Z86.010, Personal history of colonic polyps                           D12.4, Benign neoplasm of descending colon CPT copyright 2016 American Medical Association. All rights reserved. The codes documented in this report are preliminary and upon coder review may  be revised to meet current compliance requirements. Earle Gell, MD Garlan Fair, MD 02/06/2016 11:45:42 AM This report has been signed electronically. Number of Addenda: 0

## 2016-02-06 NOTE — Discharge Instructions (Signed)
Colonoscopy, Care After °Refer to this sheet in the next few weeks. These instructions provide you with information on caring for yourself after your procedure. Your health care provider may also give you more specific instructions. Your treatment has been planned according to current medical practices, but problems sometimes occur. Call your health care provider if you have any problems or questions after your procedure. °WHAT TO EXPECT AFTER THE PROCEDURE  °After your procedure, it is typical to have the following: °· A small amount of blood in your stool. °· Moderate amounts of gas and mild abdominal cramping or bloating. °HOME CARE INSTRUCTIONS °· Do not drive, operate machinery, or sign important documents for 24 hours. °· You may shower and resume your regular physical activities, but move at a slower pace for the first 24 hours. °· Take frequent rest periods for the first 24 hours. °· Walk around or put a warm pack on your abdomen to help reduce abdominal cramping and bloating. °· Drink enough fluids to keep your urine clear or pale yellow. °· You may resume your normal diet as instructed by your health care provider. Avoid heavy or fried foods that are hard to digest. °· Avoid drinking alcohol for 24 hours or as instructed by your health care provider. °· Only take over-the-counter or prescription medicines as directed by your health care provider. °· If a tissue sample (biopsy) was taken during your procedure: °¨ Do not take aspirin or blood thinners for 7 days, or as instructed by your health care provider. °¨ Do not drink alcohol for 7 days, or as instructed by your health care provider. °¨ Eat soft foods for the first 24 hours. °SEEK MEDICAL CARE IF: °You have persistent spotting of blood in your stool 2-3 days after the procedure. °SEEK IMMEDIATE MEDICAL CARE IF: °· You have more than a small spotting of blood in your stool. °· You pass large blood clots in your stool. °· Your abdomen is swollen  (distended). °· You have nausea or vomiting. °· You have a fever. °· You have increasing abdominal pain that is not relieved with medicine. °  °This information is not intended to replace advice given to you by your health care provider. Make sure you discuss any questions you have with your health care provider. °  °Document Released: 03/20/2004 Document Revised: 05/27/2013 Document Reviewed: 04/13/2013 °Elsevier Interactive Patient Education ©2016 [employer] ° °

## 2016-02-06 NOTE — Anesthesia Preprocedure Evaluation (Addendum)
Anesthesia Evaluation  Patient identified by MRN, date of birth, ID band Patient awake    Reviewed: Allergy & Precautions, NPO status , Patient's Chart, lab work & pertinent test results  History of Anesthesia Complications Negative for: history of anesthetic complications  Airway Mallampati: I       Dental  (+) Teeth Intact   Pulmonary neg pulmonary ROS, former smoker,    breath sounds clear to auscultation       Cardiovascular hypertension,  Rhythm:Regular Rate:Normal     Neuro/Psych    GI/Hepatic Neg liver ROS, GERD  ,  Endo/Other  negative endocrine ROS  Renal/GU negative Renal ROS     Musculoskeletal  (+) Arthritis ,   Abdominal   Peds  Hematology negative hematology ROS (+)   Anesthesia Other Findings   Reproductive/Obstetrics                            Anesthesia Physical Anesthesia Plan  ASA: II  Anesthesia Plan: MAC   Post-op Pain Management:    Induction: Intravenous  Airway Management Planned: Natural Airway and Simple Face Mask  Additional Equipment:   Intra-op Plan:   Post-operative Plan:   Informed Consent: I have reviewed the patients History and Physical, chart, labs and discussed the procedure including the risks, benefits and alternatives for the proposed anesthesia with the patient or authorized representative who has indicated his/her understanding and acceptance.   Dental advisory given  Plan Discussed with: CRNA and Surgeon  Anesthesia Plan Comments:         Anesthesia Quick Evaluation

## 2016-02-06 NOTE — Transfer of Care (Signed)
Immediate Anesthesia Transfer of Care Note  Patient: [name]  Procedure(s) Performed: Procedure(s): COLONOSCOPY WITH PROPOFOL (N/A)  Patient Location: PACU  Anesthesia Type:MAC  Level of Consciousness: awake, alert  and oriented  Airway & Oxygen Therapy: Patient Spontanous Breathing and Patient connected to face mask oxygen  Post-op Assessment: Report given to RN and Post -op Vital signs reviewed and stable  Post vital signs: Reviewed and stable  Last Vitals:  Filed Vitals:   02/06/16 1030  BP: 151/88  Temp: 37.2 C  Resp: 12    Last Pain: There were no vitals filed for this visit.       Complications: No apparent anesthesia complications

## 2016-02-06 NOTE — H&P (Signed)
  Procedure: Surveillance colonoscopy. 11/20/2010 colonoscopy was performed with removal of a small adenomatous ascending colon polyp and small adenomatous transverse colon polyp  History: The patient is a 70 year old male born [birth year]. He is scheduled to undergo a surveillance colonoscopy today.  Past medical history: Hypertension. Hypercholesterolemia. Allergic rhinitis. Asthma. Right thalamic stroke diagnosed in 1997. Obstructive sleep apnea syndrome. Left shoulder surgery. Internal hemorrhoidectomy. Benign prostatic hypertrophy.  Family history: Father diagnosed with colon cancer.  Exam: The patient is alert and lying comfortably on the endoscopy stretcher. Abdomen is soft and nontender to palpation. Lungs are clear to auscultation. Cardiac exam reveals a regular rhythm.  Plan: Proceed with surveillance colonoscopy

## 2016-02-07 ENCOUNTER — Encounter (HOSPITAL_COMMUNITY): Payer: Self-pay | Admitting: Gastroenterology

## 2016-02-08 NOTE — Anesthesia Postprocedure Evaluation (Signed)
Anesthesia Post Note  Patient: [name]  Procedure(s) Performed: Procedure(s) (LRB): COLONOSCOPY WITH PROPOFOL (N/A)  Patient location during evaluation: Endoscopy Anesthesia Type: MAC Level of consciousness: awake and alert Pain management: pain level controlled Vital Signs Assessment: post-procedure vital signs reviewed and stable Respiratory status: spontaneous breathing, nonlabored ventilation, respiratory function stable and patient connected to nasal cannula oxygen Cardiovascular status: stable and blood pressure returned to baseline Anesthetic complications: no    Last Vitals:  Filed Vitals:   02/06/16 1214 02/06/16 1215  BP: 168/80 168/80  Pulse: 53 53  Temp:    Resp: 15 16    Last Pain: There were no vitals filed for this visit.               Rachard Isidro,JAMES TERRILL

## 2016-06-09 IMAGING — US US AORTA
1 series · 14 of 25 positions shown · non-contrast
Comparison: None.

CLINICAL DATA: Evaluation for possible abdominal aortic aneurysm

EXAM:
ULTRASOUND OF ABDOMINAL AORTA
TECHNIQUE: Ultrasound examination of the abdominal aorta was performed to
evaluate for abdominal aortic aneurysm.

[Series 1: us aorta · 0.29mm/px · 14 of 33 slices shown]
[im 1/33]
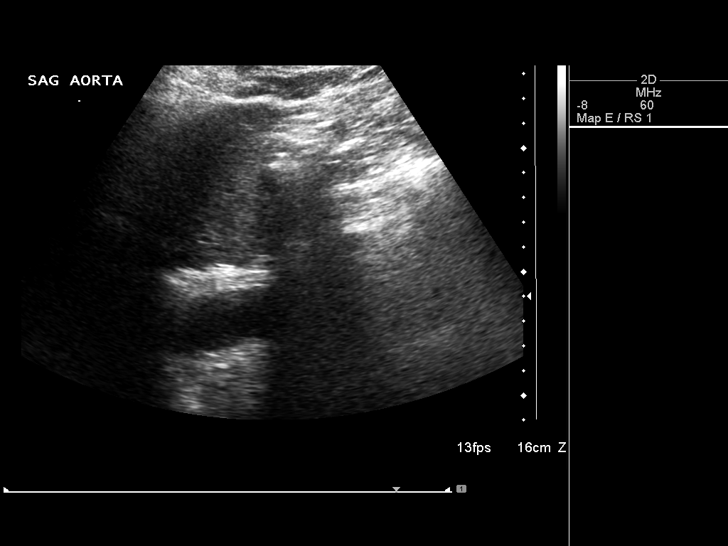
[im 3/33]
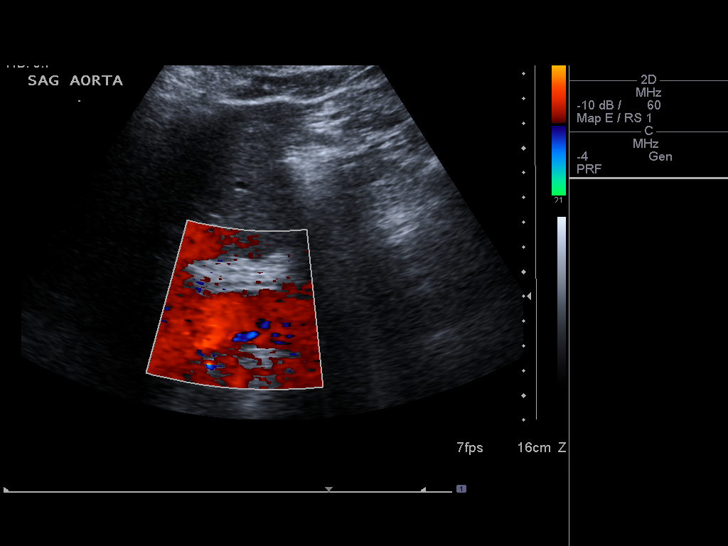
[im 6/33]
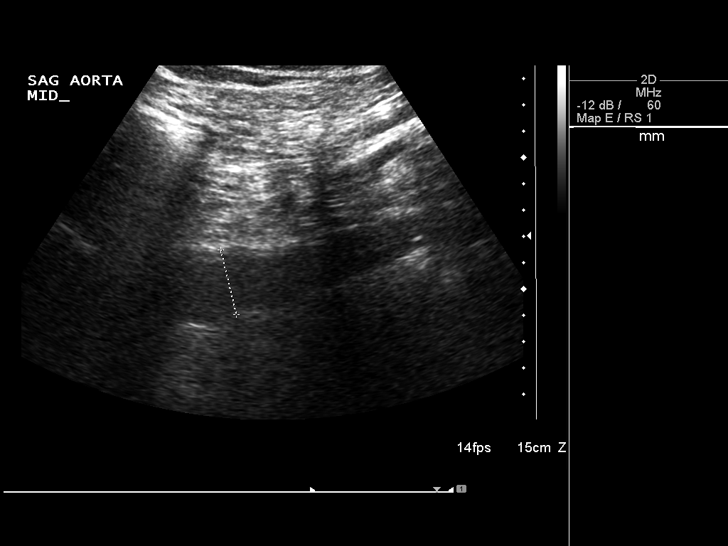
[im 9/33]
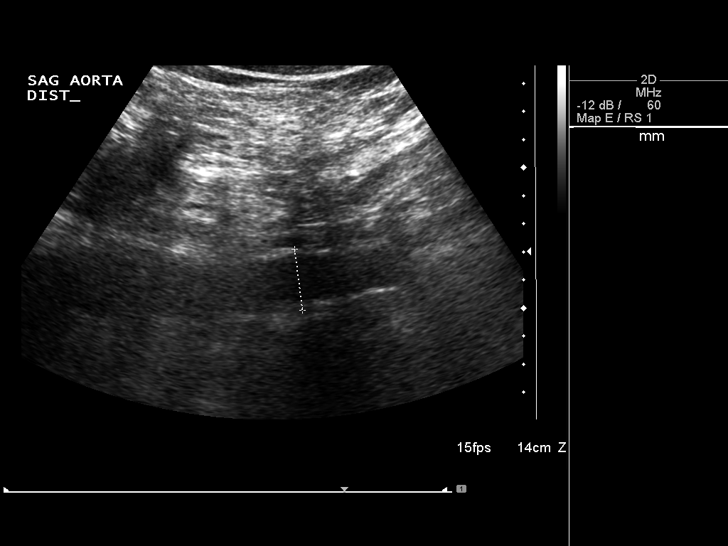
[im 11/33]
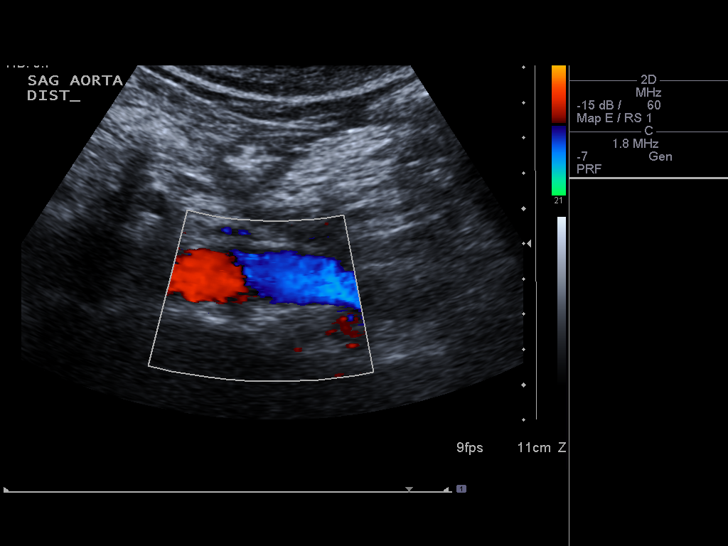
[im 13/33]
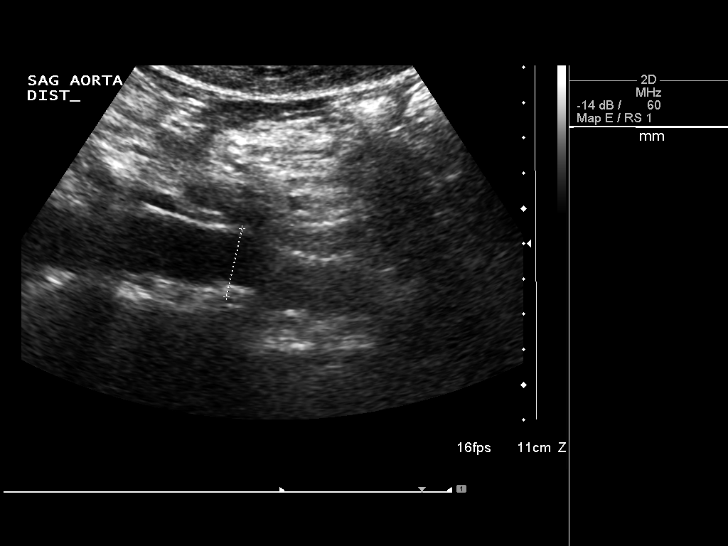
[im 15/33]
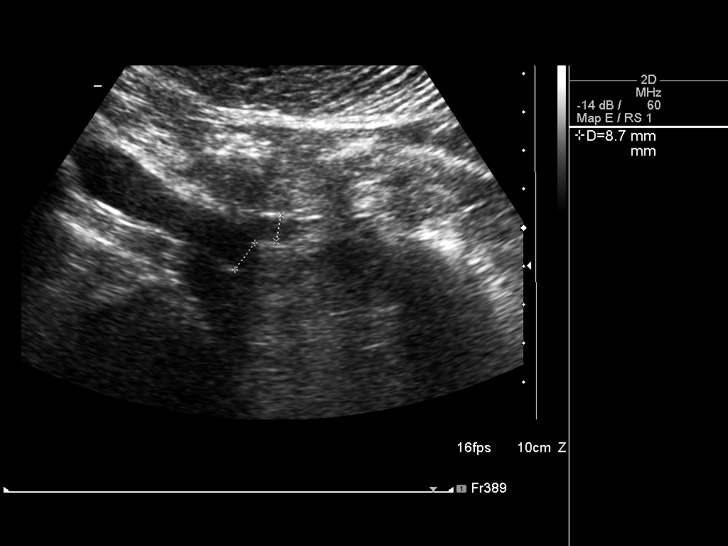
[im 18/33]
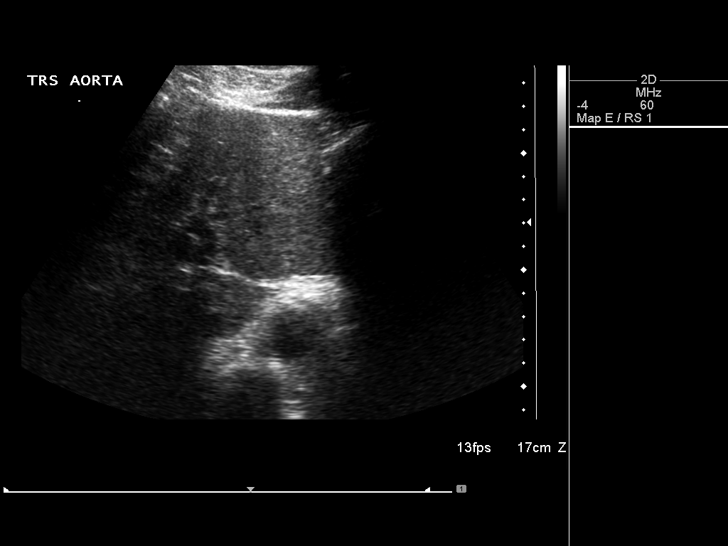
[im 21/33]
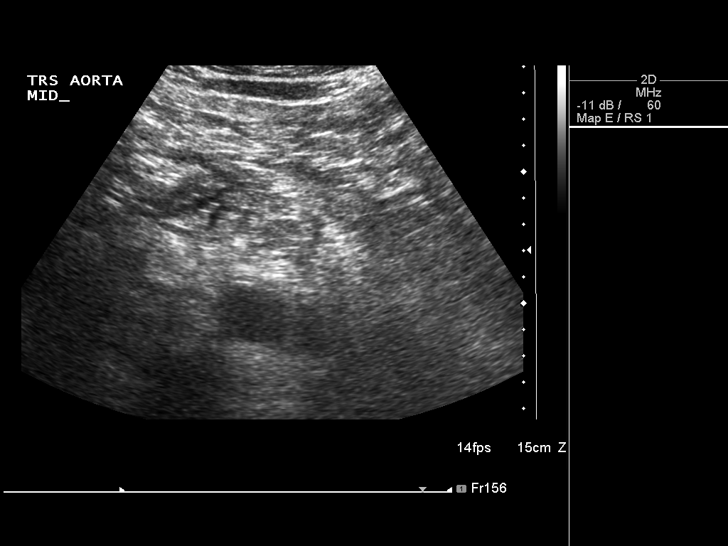
[im 22/33]
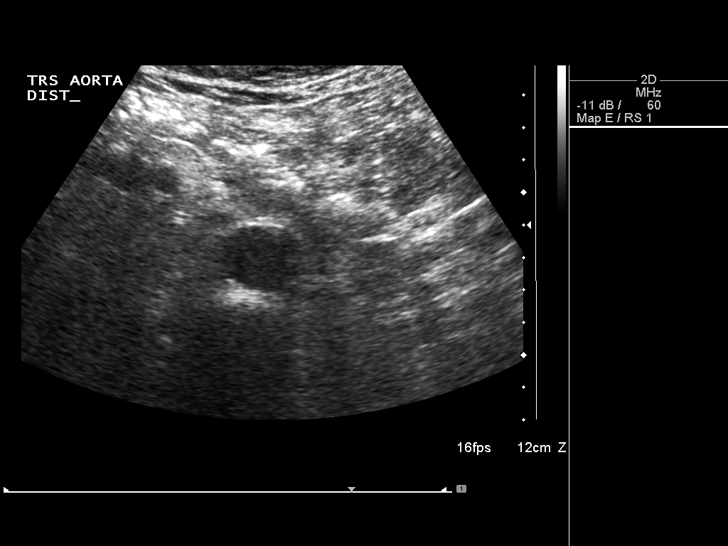
[im 25/33]
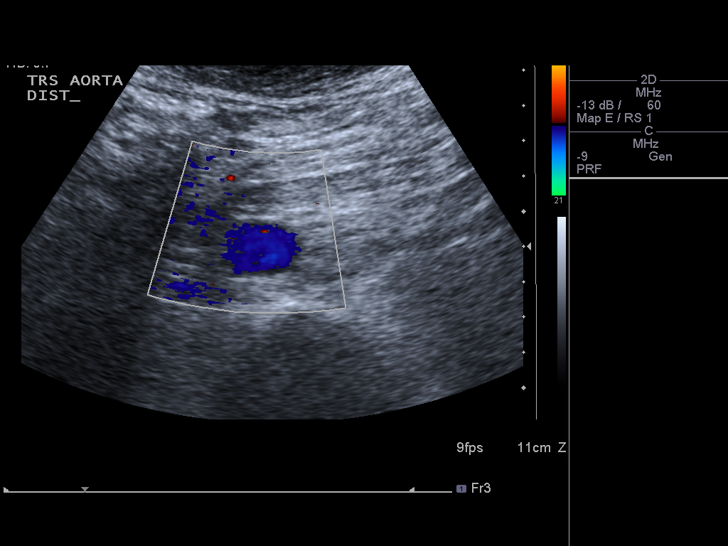
[im 27/33]
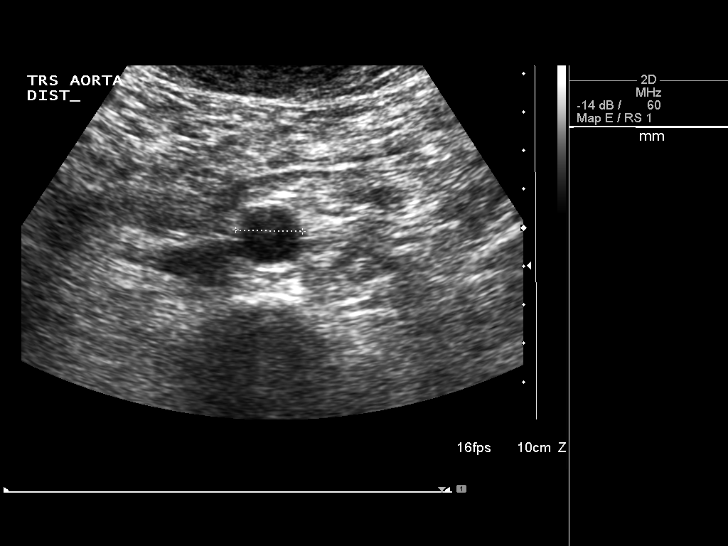
[im 30/33]
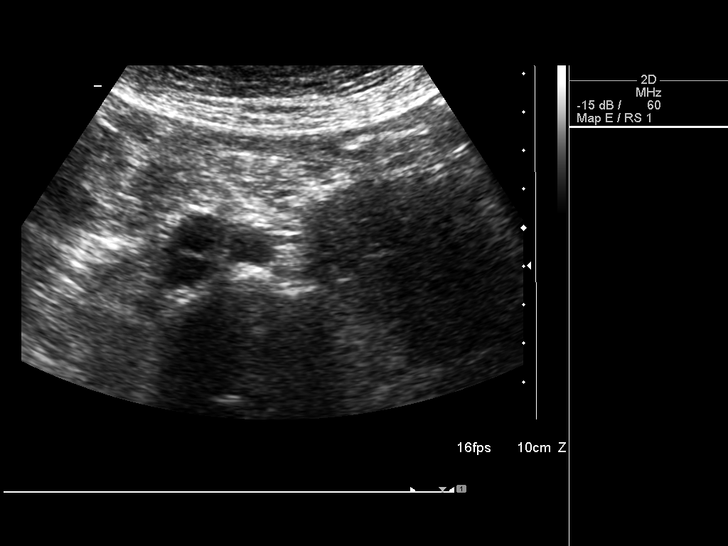
[im 33/33]
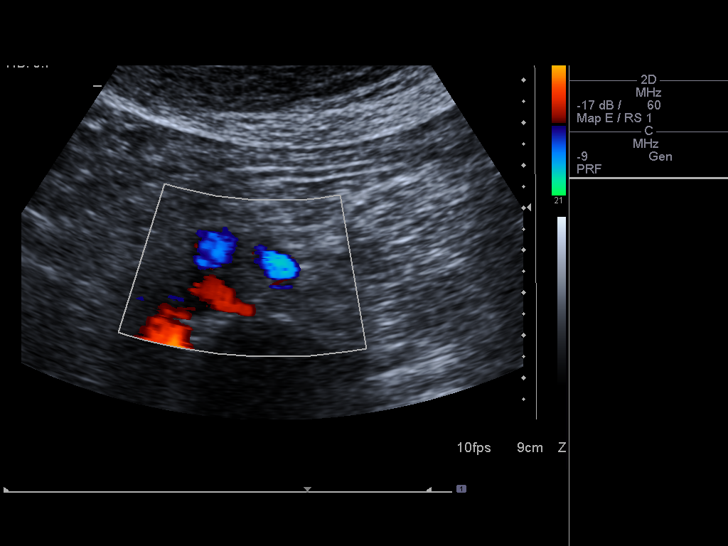

[14 of 25 positions shown; findings below may reference images not displayed]

FINDINGS: Abdominal Aorta

There is mild failure to taper of the caliber of the abdominal
aorta. The proximal aorta measures 2.4 cm AP x 2.5 cm transversely.
The mid aorta measures 2.5 cm AP x 2.3 cm transversely. The distal
aorta measures 2.2 cm AP x 2.3 cm transversely.

Maximum AP

Diameter:  2.5 cm in the mid aorta

Maximum TRV

Diameter: 2.5 cm in the proximal aorta.

The common iliac arteries measure 1.2 cm in greatest dimension.
IMPRESSION: There is no abdominal aortic aneurysm. There is failure to taper of
the caliber of the abdominal aorta.

## 2017-06-03 NOTE — Telephone Encounter (Signed)
Close Encounter 

## 2020-05-24 ENCOUNTER — Ambulatory Visit: Payer: Self-pay | Attending: Internal Medicine

## 2020-05-24 DIAGNOSIS — Z23 Encounter for immunization: Secondary | ICD-10-CM

## 2020-05-24 NOTE — Progress Notes (Signed)
   Covid-19 Vaccination Clinic  Name:  [name]    MRN: [id number] DOB: [birth year]  05/24/2020  [name] was observed post Covid-19 immunization for 15 minutes without incident. He was provided with Vaccine Information Sheet and instruction to access the V-Safe system.   [name] was instructed to call [phone] with any severe reactions post vaccine: Marland Kitchen Difficulty breathing  . Swelling of face and throat  . A fast heartbeat  . A bad rash all over body  . Dizziness and weakness

## 2020-09-27 DIAGNOSIS — K219 Gastro-esophageal reflux disease without esophagitis: Secondary | ICD-10-CM | POA: Diagnosis not present

## 2020-09-27 DIAGNOSIS — E119 Type 2 diabetes mellitus without complications: Secondary | ICD-10-CM | POA: Diagnosis not present

## 2020-09-27 DIAGNOSIS — J45991 Cough variant asthma: Secondary | ICD-10-CM | POA: Diagnosis not present

## 2020-09-27 DIAGNOSIS — E782 Mixed hyperlipidemia: Secondary | ICD-10-CM | POA: Diagnosis not present

## 2020-09-27 DIAGNOSIS — G4700 Insomnia, unspecified: Secondary | ICD-10-CM | POA: Diagnosis not present

## 2020-09-27 DIAGNOSIS — I10 Essential (primary) hypertension: Secondary | ICD-10-CM | POA: Diagnosis not present

## 2020-09-27 DIAGNOSIS — E785 Hyperlipidemia, unspecified: Secondary | ICD-10-CM | POA: Diagnosis not present

## 2020-09-27 DIAGNOSIS — D649 Anemia, unspecified: Secondary | ICD-10-CM | POA: Diagnosis not present

## 2020-11-30 DIAGNOSIS — G4700 Insomnia, unspecified: Secondary | ICD-10-CM | POA: Diagnosis not present

## 2020-11-30 DIAGNOSIS — E785 Hyperlipidemia, unspecified: Secondary | ICD-10-CM | POA: Diagnosis not present

## 2020-11-30 DIAGNOSIS — I10 Essential (primary) hypertension: Secondary | ICD-10-CM | POA: Diagnosis not present

## 2020-11-30 DIAGNOSIS — K219 Gastro-esophageal reflux disease without esophagitis: Secondary | ICD-10-CM | POA: Diagnosis not present

## 2020-11-30 DIAGNOSIS — E119 Type 2 diabetes mellitus without complications: Secondary | ICD-10-CM | POA: Diagnosis not present

## 2020-11-30 DIAGNOSIS — D649 Anemia, unspecified: Secondary | ICD-10-CM | POA: Diagnosis not present

## 2020-11-30 DIAGNOSIS — J45991 Cough variant asthma: Secondary | ICD-10-CM | POA: Diagnosis not present

## 2020-11-30 DIAGNOSIS — E782 Mixed hyperlipidemia: Secondary | ICD-10-CM | POA: Diagnosis not present

## 2020-12-07 ENCOUNTER — Other Ambulatory Visit (HOSPITAL_BASED_OUTPATIENT_CLINIC_OR_DEPARTMENT_OTHER): Payer: Self-pay

## 2020-12-08 ENCOUNTER — Other Ambulatory Visit: Payer: Self-pay

## 2020-12-08 ENCOUNTER — Other Ambulatory Visit (HOSPITAL_BASED_OUTPATIENT_CLINIC_OR_DEPARTMENT_OTHER): Payer: Self-pay

## 2020-12-08 ENCOUNTER — Ambulatory Visit: Payer: Self-pay | Attending: Internal Medicine

## 2020-12-08 DIAGNOSIS — Z23 Encounter for immunization: Secondary | ICD-10-CM

## 2020-12-08 MED ORDER — PFIZER-BIONT COVID-19 VAC-TRIS 30 MCG/0.3ML IM SUSP
INTRAMUSCULAR | 0 refills | Status: DC
  Filled 2020-12-08: qty 0.3, 1d supply, fill #0

## 2021-01-11 DIAGNOSIS — E559 Vitamin D deficiency, unspecified: Secondary | ICD-10-CM | POA: Diagnosis not present

## 2021-01-11 DIAGNOSIS — E1121 Type 2 diabetes mellitus with diabetic nephropathy: Secondary | ICD-10-CM | POA: Diagnosis not present

## 2021-01-11 DIAGNOSIS — G4700 Insomnia, unspecified: Secondary | ICD-10-CM | POA: Diagnosis not present

## 2021-01-11 DIAGNOSIS — Z1389 Encounter for screening for other disorder: Secondary | ICD-10-CM | POA: Diagnosis not present

## 2021-01-11 DIAGNOSIS — I672 Cerebral atherosclerosis: Secondary | ICD-10-CM | POA: Diagnosis not present

## 2021-01-11 DIAGNOSIS — I10 Essential (primary) hypertension: Secondary | ICD-10-CM | POA: Diagnosis not present

## 2021-01-11 DIAGNOSIS — Z0001 Encounter for general adult medical examination with abnormal findings: Secondary | ICD-10-CM | POA: Diagnosis not present

## 2021-01-11 DIAGNOSIS — R202 Paresthesia of skin: Secondary | ICD-10-CM | POA: Diagnosis not present

## 2021-01-11 DIAGNOSIS — E782 Mixed hyperlipidemia: Secondary | ICD-10-CM | POA: Diagnosis not present

## 2021-01-11 DIAGNOSIS — D649 Anemia, unspecified: Secondary | ICD-10-CM | POA: Diagnosis not present

## 2021-01-11 DIAGNOSIS — R7989 Other specified abnormal findings of blood chemistry: Secondary | ICD-10-CM | POA: Diagnosis not present

## 2021-03-01 DIAGNOSIS — E1121 Type 2 diabetes mellitus with diabetic nephropathy: Secondary | ICD-10-CM | POA: Diagnosis not present

## 2021-03-01 DIAGNOSIS — D649 Anemia, unspecified: Secondary | ICD-10-CM | POA: Diagnosis not present

## 2021-03-01 DIAGNOSIS — Z20822 Contact with and (suspected) exposure to covid-19: Secondary | ICD-10-CM | POA: Diagnosis not present

## 2021-03-01 DIAGNOSIS — K219 Gastro-esophageal reflux disease without esophagitis: Secondary | ICD-10-CM | POA: Diagnosis not present

## 2021-03-01 DIAGNOSIS — I10 Essential (primary) hypertension: Secondary | ICD-10-CM | POA: Diagnosis not present

## 2021-03-01 DIAGNOSIS — E782 Mixed hyperlipidemia: Secondary | ICD-10-CM | POA: Diagnosis not present

## 2021-03-01 DIAGNOSIS — G4700 Insomnia, unspecified: Secondary | ICD-10-CM | POA: Diagnosis not present

## 2021-03-01 DIAGNOSIS — E785 Hyperlipidemia, unspecified: Secondary | ICD-10-CM | POA: Diagnosis not present

## 2021-03-01 DIAGNOSIS — E119 Type 2 diabetes mellitus without complications: Secondary | ICD-10-CM | POA: Diagnosis not present

## 2021-03-01 DIAGNOSIS — J45991 Cough variant asthma: Secondary | ICD-10-CM | POA: Diagnosis not present

## 2021-03-28 DIAGNOSIS — M109 Gout, unspecified: Secondary | ICD-10-CM | POA: Diagnosis not present

## 2021-04-18 DIAGNOSIS — R7989 Other specified abnormal findings of blood chemistry: Secondary | ICD-10-CM | POA: Diagnosis not present

## 2021-04-18 DIAGNOSIS — M109 Gout, unspecified: Secondary | ICD-10-CM | POA: Diagnosis not present

## 2021-04-18 DIAGNOSIS — E1121 Type 2 diabetes mellitus with diabetic nephropathy: Secondary | ICD-10-CM | POA: Diagnosis not present

## 2021-04-18 DIAGNOSIS — E559 Vitamin D deficiency, unspecified: Secondary | ICD-10-CM | POA: Diagnosis not present

## 2021-04-18 DIAGNOSIS — I10 Essential (primary) hypertension: Secondary | ICD-10-CM | POA: Diagnosis not present

## 2021-04-18 DIAGNOSIS — D649 Anemia, unspecified: Secondary | ICD-10-CM | POA: Diagnosis not present

## 2021-04-18 DIAGNOSIS — Z23 Encounter for immunization: Secondary | ICD-10-CM | POA: Diagnosis not present

## 2021-04-18 DIAGNOSIS — I672 Cerebral atherosclerosis: Secondary | ICD-10-CM | POA: Diagnosis not present

## 2021-04-18 DIAGNOSIS — R202 Paresthesia of skin: Secondary | ICD-10-CM | POA: Diagnosis not present

## 2021-04-18 DIAGNOSIS — E782 Mixed hyperlipidemia: Secondary | ICD-10-CM | POA: Diagnosis not present

## 2021-04-21 DIAGNOSIS — R7989 Other specified abnormal findings of blood chemistry: Secondary | ICD-10-CM | POA: Diagnosis not present

## 2021-04-21 DIAGNOSIS — R748 Abnormal levels of other serum enzymes: Secondary | ICD-10-CM | POA: Diagnosis not present

## 2021-05-24 DIAGNOSIS — G4733 Obstructive sleep apnea (adult) (pediatric): Secondary | ICD-10-CM | POA: Diagnosis not present

## 2021-08-07 DIAGNOSIS — Z800 Family history of malignant neoplasm of digestive organs: Secondary | ICD-10-CM | POA: Diagnosis not present

## 2021-08-07 DIAGNOSIS — Z8371 Family history of colonic polyps: Secondary | ICD-10-CM | POA: Diagnosis not present

## 2021-08-07 DIAGNOSIS — D123 Benign neoplasm of transverse colon: Secondary | ICD-10-CM | POA: Diagnosis not present

## 2021-08-07 DIAGNOSIS — Z86010 Personal history of colonic polyps: Secondary | ICD-10-CM | POA: Diagnosis not present

## 2021-08-07 DIAGNOSIS — D122 Benign neoplasm of ascending colon: Secondary | ICD-10-CM | POA: Diagnosis not present

## 2021-08-09 DIAGNOSIS — G4733 Obstructive sleep apnea (adult) (pediatric): Secondary | ICD-10-CM | POA: Diagnosis not present

## 2021-08-09 DIAGNOSIS — D122 Benign neoplasm of ascending colon: Secondary | ICD-10-CM | POA: Diagnosis not present

## 2021-08-09 DIAGNOSIS — D123 Benign neoplasm of transverse colon: Secondary | ICD-10-CM | POA: Diagnosis not present

## 2021-09-26 DIAGNOSIS — G4733 Obstructive sleep apnea (adult) (pediatric): Secondary | ICD-10-CM | POA: Diagnosis not present

## 2021-10-10 DIAGNOSIS — H2513 Age-related nuclear cataract, bilateral: Secondary | ICD-10-CM | POA: Diagnosis not present

## 2021-10-10 DIAGNOSIS — H35033 Hypertensive retinopathy, bilateral: Secondary | ICD-10-CM | POA: Diagnosis not present

## 2022-01-19 ENCOUNTER — Other Ambulatory Visit: Payer: Self-pay | Admitting: Internal Medicine

## 2022-01-19 DIAGNOSIS — E559 Vitamin D deficiency, unspecified: Secondary | ICD-10-CM | POA: Diagnosis not present

## 2022-01-19 DIAGNOSIS — I672 Cerebral atherosclerosis: Secondary | ICD-10-CM | POA: Diagnosis not present

## 2022-01-19 DIAGNOSIS — D649 Anemia, unspecified: Secondary | ICD-10-CM | POA: Diagnosis not present

## 2022-01-19 DIAGNOSIS — E782 Mixed hyperlipidemia: Secondary | ICD-10-CM | POA: Diagnosis not present

## 2022-01-19 DIAGNOSIS — I10 Essential (primary) hypertension: Secondary | ICD-10-CM | POA: Diagnosis not present

## 2022-01-19 DIAGNOSIS — G4700 Insomnia, unspecified: Secondary | ICD-10-CM | POA: Diagnosis not present

## 2022-01-19 DIAGNOSIS — Z1211 Encounter for screening for malignant neoplasm of colon: Secondary | ICD-10-CM | POA: Diagnosis not present

## 2022-01-19 DIAGNOSIS — Z0000 Encounter for general adult medical examination without abnormal findings: Secondary | ICD-10-CM | POA: Diagnosis not present

## 2022-01-19 DIAGNOSIS — E1121 Type 2 diabetes mellitus with diabetic nephropathy: Secondary | ICD-10-CM | POA: Diagnosis not present

## 2022-01-19 DIAGNOSIS — R202 Paresthesia of skin: Secondary | ICD-10-CM | POA: Diagnosis not present

## 2022-03-01 DIAGNOSIS — G4733 Obstructive sleep apnea (adult) (pediatric): Secondary | ICD-10-CM | POA: Diagnosis not present

## 2022-03-05 ENCOUNTER — Ambulatory Visit
Admission: RE | Admit: 2022-03-05 | Discharge: 2022-03-05 | Disposition: A | Discharge status: Home / Self Care | Payer: No Typology Code available for payment source | Source: Ambulatory Visit | Attending: Internal Medicine | Admitting: Internal Medicine

## 2022-03-05 DIAGNOSIS — I672 Cerebral atherosclerosis: Secondary | ICD-10-CM

## 2022-04-13 DIAGNOSIS — R490 Dysphonia: Secondary | ICD-10-CM | POA: Diagnosis not present

## 2022-04-13 DIAGNOSIS — J301 Allergic rhinitis due to pollen: Secondary | ICD-10-CM | POA: Diagnosis not present

## 2022-05-08 DIAGNOSIS — E782 Mixed hyperlipidemia: Secondary | ICD-10-CM | POA: Diagnosis not present

## 2022-07-02 DIAGNOSIS — J383 Other diseases of vocal cords: Secondary | ICD-10-CM | POA: Diagnosis not present

## 2022-07-02 DIAGNOSIS — R0982 Postnasal drip: Secondary | ICD-10-CM | POA: Diagnosis not present

## 2022-07-16 ENCOUNTER — Other Ambulatory Visit: Payer: Self-pay | Admitting: Otolaryngology

## 2022-07-17 ENCOUNTER — Other Ambulatory Visit: Payer: Self-pay

## 2022-07-17 ENCOUNTER — Encounter (HOSPITAL_BASED_OUTPATIENT_CLINIC_OR_DEPARTMENT_OTHER): Payer: Self-pay | Admitting: Otolaryngology

## 2022-07-18 ENCOUNTER — Encounter (HOSPITAL_BASED_OUTPATIENT_CLINIC_OR_DEPARTMENT_OTHER)
Admission: RE | Admit: 2022-07-18 | Discharge: 2022-07-18 | Disposition: A | Discharge status: Home / Self Care | Payer: Medicare Other | Source: Ambulatory Visit | Attending: Otolaryngology | Admitting: Otolaryngology

## 2022-07-18 DIAGNOSIS — Z01810 Encounter for preprocedural cardiovascular examination: Secondary | ICD-10-CM | POA: Diagnosis not present

## 2022-07-18 LAB — BASIC METABOLIC PANEL
Anion gap: 7 (ref 5–15)
BUN: 19 mg/dL (ref 8–23)
CO2: 26 mmol/L (ref 22–32)
Calcium: 9.2 mg/dL (ref 8.9–10.3)
Chloride: 104 mmol/L (ref 98–111)
Creatinine, Ser: 1.56 mg/dL — ABNORMAL HIGH (ref 0.61–1.24)
GFR, Estimated: 46 mL/min — ABNORMAL LOW (ref >60)
Glucose, Bld: 109 mg/dL — ABNORMAL HIGH (ref 70–99)
Potassium: 4.3 mmol/L (ref 3.5–5.1)
Sodium: 137 mmol/L (ref 135–145)

## 2022-07-20 ENCOUNTER — Ambulatory Visit (HOSPITAL_BASED_OUTPATIENT_CLINIC_OR_DEPARTMENT_OTHER): Payer: Medicare Other | Admitting: Anesthesiology

## 2022-07-20 ENCOUNTER — Encounter (HOSPITAL_BASED_OUTPATIENT_CLINIC_OR_DEPARTMENT_OTHER)
Admission: RE | Disposition: A | Discharge status: Home / Self Care | Payer: Self-pay | Source: Home / Self Care | Attending: Otolaryngology

## 2022-07-20 ENCOUNTER — Encounter (HOSPITAL_BASED_OUTPATIENT_CLINIC_OR_DEPARTMENT_OTHER): Payer: Self-pay | Admitting: Otolaryngology

## 2022-07-20 ENCOUNTER — Ambulatory Visit (HOSPITAL_BASED_OUTPATIENT_CLINIC_OR_DEPARTMENT_OTHER)
Admission: RE | Admit: 2022-07-20 | Discharge: 2022-07-20 | Disposition: A | Discharge status: Home / Self Care | Payer: Medicare Other | Attending: Otolaryngology | Admitting: Otolaryngology

## 2022-07-20 ENCOUNTER — Other Ambulatory Visit: Payer: Self-pay

## 2022-07-20 DIAGNOSIS — G4730 Sleep apnea, unspecified: Secondary | ICD-10-CM | POA: Diagnosis not present

## 2022-07-20 DIAGNOSIS — R490 Dysphonia: Secondary | ICD-10-CM | POA: Insufficient documentation

## 2022-07-20 DIAGNOSIS — R7303 Prediabetes: Secondary | ICD-10-CM | POA: Diagnosis not present

## 2022-07-20 DIAGNOSIS — M1990 Unspecified osteoarthritis, unspecified site: Secondary | ICD-10-CM | POA: Diagnosis not present

## 2022-07-20 DIAGNOSIS — Z87891 Personal history of nicotine dependence: Secondary | ICD-10-CM | POA: Insufficient documentation

## 2022-07-20 DIAGNOSIS — I10 Essential (primary) hypertension: Secondary | ICD-10-CM | POA: Diagnosis not present

## 2022-07-20 DIAGNOSIS — G4733 Obstructive sleep apnea (adult) (pediatric): Secondary | ICD-10-CM | POA: Diagnosis not present

## 2022-07-20 DIAGNOSIS — J383 Other diseases of vocal cords: Secondary | ICD-10-CM | POA: Diagnosis not present

## 2022-07-20 DIAGNOSIS — K219 Gastro-esophageal reflux disease without esophagitis: Secondary | ICD-10-CM | POA: Diagnosis not present

## 2022-07-20 DIAGNOSIS — Z7982 Long term (current) use of aspirin: Secondary | ICD-10-CM | POA: Insufficient documentation

## 2022-07-20 DIAGNOSIS — Z9989 Dependence on other enabling machines and devices: Secondary | ICD-10-CM | POA: Diagnosis not present

## 2022-07-20 DIAGNOSIS — Z8673 Personal history of transient ischemic attack (TIA), and cerebral infarction without residual deficits: Secondary | ICD-10-CM | POA: Diagnosis not present

## 2022-07-20 DIAGNOSIS — J382 Nodules of vocal cords: Secondary | ICD-10-CM | POA: Diagnosis not present

## 2022-07-20 HISTORY — DX: Sleep apnea, unspecified: G47.30

## 2022-07-20 HISTORY — PX: MICROLARYNGOSCOPY WITH CO2 LASER AND EXCISION OF VOCAL CORD LESION: SHX5970

## 2022-07-20 HISTORY — DX: Prediabetes: R73.03

## 2022-07-20 HISTORY — DX: Other diseases of vocal cords: J38.3

## 2022-07-20 SURGERY — MICROLARYNGOSCOPY WITH CO2 LASER AND EXCISION OF VOCAL CORD LESION
Anesthesia: General | Site: Throat | Laterality: Bilateral

## 2022-07-20 MED ORDER — METHYLENE BLUE 1 % INJ SOLN
INTRAVENOUS | Status: AC
  Filled 2022-07-20: qty 10

## 2022-07-20 MED ORDER — ONDANSETRON HCL 4 MG/2ML IJ SOLN: 4.0000 mg | Freq: Once | INTRAMUSCULAR | Status: DC | PRN

## 2022-07-20 MED ORDER — ONDANSETRON HCL 4 MG/2ML IJ SOLN
INTRAMUSCULAR | Status: DC | PRN
  Administered 2022-07-20: 4 mg via INTRAVENOUS

## 2022-07-20 MED ORDER — CEFAZOLIN SODIUM-DEXTROSE 2-4 GM/100ML-% IV SOLN
INTRAVENOUS | Status: AC
  Filled 2022-07-20: qty 100

## 2022-07-20 MED ORDER — FENTANYL CITRATE (PF) 100 MCG/2ML IJ SOLN
INTRAMUSCULAR | Status: DC | PRN
  Administered 2022-07-20: 50 ug via INTRAVENOUS

## 2022-07-20 MED ORDER — OXYCODONE HCL 5 MG PO TABS: 5.0000 mg | ORAL_TABLET | Freq: Once | ORAL | Status: DC | PRN

## 2022-07-20 MED ORDER — CEFAZOLIN SODIUM-DEXTROSE 2-4 GM/100ML-% IV SOLN
2.0000 g | INTRAVENOUS | Status: AC
  Administered 2022-07-20: 2 g via INTRAVENOUS

## 2022-07-20 MED ORDER — PHENYLEPHRINE HCL (PRESSORS) 10 MG/ML IV SOLN
INTRAVENOUS | Status: AC
  Filled 2022-07-20: qty 1

## 2022-07-20 MED ORDER — LIDOCAINE HCL (PF) 2 % IJ SOLN
INTRAMUSCULAR | Status: DC | PRN
  Administered 2022-07-20: 4 mL

## 2022-07-20 MED ORDER — DEXAMETHASONE SODIUM PHOSPHATE 4 MG/ML IJ SOLN
INTRAMUSCULAR | Status: DC | PRN
  Administered 2022-07-20: 10 mg via INTRAVENOUS

## 2022-07-20 MED ORDER — FENTANYL CITRATE (PF) 100 MCG/2ML IJ SOLN
INTRAMUSCULAR | Status: AC
  Filled 2022-07-20: qty 2

## 2022-07-20 MED ORDER — SUGAMMADEX SODIUM 500 MG/5ML IV SOLN
INTRAVENOUS | Status: DC | PRN
  Administered 2022-07-20: 350 mg via INTRAVENOUS

## 2022-07-20 MED ORDER — PROPOFOL 10 MG/ML IV BOLUS
INTRAVENOUS | Status: DC | PRN
  Administered 2022-07-20: 150 mg via INTRAVENOUS

## 2022-07-20 MED ORDER — METHYLENE BLUE 1 % INJ SOLN
INTRAVENOUS | Status: DC | PRN
  Administered 2022-07-20: 1 mL

## 2022-07-20 MED ORDER — EPINEPHRINE PF 1 MG/ML IJ SOLN
INTRAMUSCULAR | Status: AC
  Filled 2022-07-20: qty 1

## 2022-07-20 MED ORDER — PHENYLEPHRINE HCL-NACL 20-0.9 MG/250ML-% IV SOLN
INTRAVENOUS | Status: DC | PRN
  Administered 2022-07-20: 35 ug/min via INTRAVENOUS

## 2022-07-20 MED ORDER — OXYCODONE HCL 5 MG/5ML PO SOLN: 5.0000 mg | Freq: Once | ORAL | Status: DC | PRN

## 2022-07-20 MED ORDER — LACTATED RINGERS IV SOLN: INTRAVENOUS | Status: DC

## 2022-07-20 MED ORDER — GLYCOPYRROLATE 0.2 MG/ML IJ SOLN
INTRAMUSCULAR | Status: DC | PRN
  Administered 2022-07-20: .2 mg via INTRAVENOUS

## 2022-07-20 MED ORDER — ROCURONIUM BROMIDE 10 MG/ML (PF) SYRINGE
PREFILLED_SYRINGE | INTRAVENOUS | Status: AC
  Filled 2022-07-20: qty 10

## 2022-07-20 MED ORDER — ROCURONIUM BROMIDE 100 MG/10ML IV SOLN
INTRAVENOUS | Status: DC | PRN
  Administered 2022-07-20: 30 mg via INTRAVENOUS
  Administered 2022-07-20: 40 mg via INTRAVENOUS

## 2022-07-20 MED ORDER — EPINEPHRINE PF 1 MG/ML IJ SOLN
INTRAMUSCULAR | Status: DC | PRN
  Administered 2022-07-20: 1 mg via ENDOTRACHEOPULMONARY

## 2022-07-20 MED ORDER — FENTANYL CITRATE (PF) 100 MCG/2ML IJ SOLN: 25.0000 ug | INTRAMUSCULAR | Status: DC | PRN

## 2022-07-20 MED ORDER — LIDOCAINE HCL (CARDIAC) PF 100 MG/5ML IV SOSY
PREFILLED_SYRINGE | INTRAVENOUS | Status: DC | PRN
  Administered 2022-07-20: 60 mg via INTRAVENOUS

## 2022-07-20 SURGICAL SUPPLY — 25 items
CANISTER SUCT 1200ML W/VALVE (MISCELLANEOUS) ×2 IMPLANT
CNTNR URN SCR LID CUP LEK RST (MISCELLANEOUS) IMPLANT
CONT SPEC 4OZ STRL OR WHT (MISCELLANEOUS)
DEFOGGER MIRROR 1QT (MISCELLANEOUS) IMPLANT
DEPRESSOR TONGUE 6 IN STERILE (GAUZE/BANDAGES/DRESSINGS) ×2 IMPLANT
GAUZE SPONGE 4X4 12PLY STRL LF (GAUZE/BANDAGES/DRESSINGS) ×4 IMPLANT
GLOVE BIO SURGEON STRL SZ7.5 (GLOVE) ×2 IMPLANT
GLOVE BIOGEL PI IND STRL 7.0 (GLOVE) IMPLANT
GOWN STRL REUS W/ TWL LRG LVL3 (GOWN DISPOSABLE) IMPLANT
GOWN STRL REUS W/TWL LRG LVL3 (GOWN DISPOSABLE) ×2
GUARD TEETH (MISCELLANEOUS) ×2 IMPLANT
NDL HYPO 18GX1.5 BLUNT FILL (NEEDLE) ×2 IMPLANT
NDL SPNL 22GX7 QUINCKE BK (NEEDLE) IMPLANT
NEEDLE HYPO 18GX1.5 BLUNT FILL (NEEDLE) ×1 IMPLANT
NEEDLE SPNL 22GX7 QUINCKE BK (NEEDLE) ×1 IMPLANT
NS IRRIG 1000ML POUR BTL (IV SOLUTION) ×2 IMPLANT
PACK BASIN DAY SURGERY FS (CUSTOM PROCEDURE TRAY) ×2 IMPLANT
PATTIES SURGICAL .5 X3 (DISPOSABLE) ×2 IMPLANT
SHEET MEDIUM DRAPE 40X70 STRL (DRAPES) ×2 IMPLANT
SLEEVE SCD COMPRESS KNEE MED (STOCKING) IMPLANT
SOL ANTI FOG 6CC (MISCELLANEOUS) IMPLANT
SYR 5ML LL (SYRINGE) ×2 IMPLANT
SYR CONTROL 10ML LL (SYRINGE) IMPLANT
TOWEL GREEN STERILE FF (TOWEL DISPOSABLE) ×2 IMPLANT
TUBE CONNECTING 20X1/4 (TUBING) ×2 IMPLANT

## 2022-07-20 NOTE — Brief Op Note (Signed)
07/20/2022  10:48 AM  PATIENT:  [name]  76 y.o. male  PRE-OPERATIVE DIAGNOSIS:  Vocal cord mass  POST-OPERATIVE DIAGNOSIS:  Vocal Cord Mass  PROCEDURE:  Procedure(s): MICROLARYNGOSCOPY WITH CO2 LASER AND EXCISION OF VOCAL CORD LESION (Bilateral)  SURGEON:  Surgeon(s) and Role:    Melida Quitter, MD - Primary  PHYSICIAN ASSISTANT:   ASSISTANTS: none   ANESTHESIA:   general  EBL:  Minimal   BLOOD ADMINISTERED:none  DRAINS: none   LOCAL MEDICATIONS USED:  NONE  SPECIMEN:  Source of Specimen:  bilateral anterior vocal cord polyp  DISPOSITION OF SPECIMEN:  PATHOLOGY  COUNTS:  YES  TOURNIQUET:  * No tourniquets in log *  DICTATION: .Note written in EPIC  PLAN OF CARE: Discharge to home after PACU  PATIENT DISPOSITION:  PACU - hemodynamically stable.   Delay start of Pharmacological VTE agent (>24hrs) due to surgical blood loss or risk of bleeding: no

## 2022-07-20 NOTE — Discharge Instructions (Signed)

## 2022-07-20 NOTE — H&P (Signed)
[name] is an 76 y.o. male.   Chief Complaint: Hoarseness and vocal fold polyps HPI: 76 year old male with a few months of hoarseness that did not respond to medications and was found to have a couple of lesions of the left vocal fold.  He presents for surgical management.  Past Medical History:  Diagnosis Date   Anal fissure    Arthritis    hands,knees, ankles   CVA (cerebral infarction)    GERD (gastroesophageal reflux disease)    Hemorrhoids    Hyperlipidemia    Hypertension    Lesion of vocal cord    Pre-diabetes    Sinus drainage    post nasal drip from sinuses occasional.   Sleep apnea    uses CPAP   Stroke ([place]) 1997   slight weakness left side-not a problem.    Past Surgical History:  Procedure Laterality Date   ANAL FISSURE REPAIR  05/11/2011   COLONOSCOPY WITH PROPOFOL N/A 02/06/2016   Procedure: COLONOSCOPY WITH PROPOFOL;  Surgeon: Garlan Fair, MD;  Location: [facility];  Service: Endoscopy;  Laterality: N/A;   HEMORRHOID SURGERY     HERNIA REPAIR     ROTATOR CUFF REPAIR  08/20/2008   left    Family History  Problem Relation Age of Onset   Heart disease Mother        Died of MI at age 13   Cancer Father        stomach   Cancer Paternal Uncle        prostate   Social History:  reports that he quit smoking about 42 years ago. His smoking use included cigarettes. He does not have any smokeless tobacco history on file. He reports that he does not currently use drugs after having used the following drugs: Marijuana. He reports that he does not drink alcohol.  Allergies:  Allergies  Allergen Reactions   Hydroxyzine Other (See Comments)    Reaction: Unknown    Medications Prior to Admission  Medication Sig Dispense Refill   aspirin 325 MG tablet Take 325 mg by mouth daily.       atorvastatin (LIPITOR) 10 MG tablet Take 20 mg by mouth daily.     azelastine (ASTELIN) 0.1 % nasal spray Place 137 mcg into the nose as needed.      cholecalciferol (VITAMIN D3) 25 MCG (1000 UNIT) tablet Take 1,000 Units by mouth daily.     GARLIC PO Take by mouth.     metoprolol succinate (TOPROL-XL) 50 MG 24 hr tablet Take 50 mg by mouth daily. Take with or immediately following a meal.     omeprazole (PRILOSEC) 20 MG capsule Take 20 mg by mouth daily as needed (acid).      tamsulosin (FLOMAX) 0.4 MG CAPS capsule Take 0.4 mg by mouth.     valsartan-hydrochlorothiazide (DIOVAN-HCT) 160-12.5 MG per tablet Take 1 tablet by mouth daily.       COVID-19 mRNA Vac-TriS, Pfizer, (PFIZER-BIONT COVID-19 VAC-TRIS) SUSP injection Inject into the muscle. 0.3 mL 0    No results found for this or any previous visit (from the past 48 hour(s)). No results found.  Review of Systems  All other systems reviewed and are negative.   Blood pressure (!) 162/74, pulse (!) 51, temperature 99 F (37.2 C), temperature source Oral, resp. rate 18, height '5\' 5"'$  (1.651 m), weight 87 kg, SpO2 100 %. Physical Exam Constitutional:      Appearance: Normal appearance. He is normal weight.  HENT:     Head: Normocephalic and atraumatic.     Right Ear: External ear normal.     Left Ear: External ear normal.     Nose: Nose normal.     Mouth/Throat:     Mouth: Mucous membranes are moist.     Pharynx: Oropharynx is clear.     Comments: Mildly hoarse. Eyes:     Extraocular Movements: Extraocular movements intact.     Conjunctiva/sclera: Conjunctivae normal.     Pupils: Pupils are equal, round, and reactive to light.  Cardiovascular:     Rate and Rhythm: Normal rate.  Pulmonary:     Effort: Pulmonary effort is normal.  Musculoskeletal:     Cervical back: Normal range of motion.  Skin:    General: Skin is warm and dry.  Neurological:     General: No focal deficit present.     Mental Status: He is alert and oriented to person, place, and time.  Psychiatric:        Mood and Affect: Mood normal.        Behavior: Behavior normal.        Thought Content: Thought  content normal.        Judgment: Judgment normal.      Assessment/Plan Hoarseness, vocal fold polyps  To OR for SMDL with excision of polyps.  Melida Quitter, MD 07/20/2022, 10:02 AM

## 2022-07-20 NOTE — Anesthesia Procedure Notes (Signed)
Procedure Name: Intubation Date/Time: 07/20/2022 10:31 AM  Performed by: Verita Lamb, CRNAPre-anesthesia Checklist: Patient identified, Emergency Drugs available, Suction available and Patient being monitored Patient Re-evaluated:Patient Re-evaluated prior to induction Oxygen Delivery Method: Circle system utilized Preoxygenation: Pre-oxygenation with 100% oxygen Induction Type: IV induction Ventilation: Mask ventilation without difficulty Laryngoscope Size: Glidescope, 4 and Mac Grade View: Grade I Tube type: Oral Laser Tube: Laser Tube Tube size: 6.0 mm Number of attempts: 1 Airway Equipment and Method: Stylet and Oral airway Placement Confirmation: ETT inserted through vocal cords under direct vision, positive ETCO2, breath sounds checked- equal and bilateral and CO2 detector Secured at: 22 cm Tube secured with: Tape Dental Injury: Teeth and Oropharynx as per pre-operative assessment  Comments: Distal cuff inflated with methylene blue ns, proximal cuff filled with ns, emergency ns available, wet gauze on face and paper tape used, FIO2 to 30 percent for lasering

## 2022-07-20 NOTE — Anesthesia Postprocedure Evaluation (Signed)
Anesthesia Post Note  Patient: [name]  Procedure(s) Performed: MICROLARYNGOSCOPY WITH CO2 LASER AND EXCISION OF VOCAL CORD LESION (Bilateral: Throat)     Patient location during evaluation: PACU Anesthesia Type: General Level of consciousness: awake and alert Pain management: pain level controlled Vital Signs Assessment: post-procedure vital signs reviewed and stable Respiratory status: spontaneous breathing, nonlabored ventilation and respiratory function stable Cardiovascular status: stable and blood pressure returned to baseline Anesthetic complications: no   No notable events documented.  Last Vitals:  Vitals:   07/20/22 1115 07/20/22 1130  BP: (!) 140/73 (!) 164/87  Pulse: 63 63  Resp: 16 16  Temp:  36.6 C  SpO2: 90% 96%    Last Pain:  Vitals:   07/20/22 1130  TempSrc:   PainSc: 0-No pain                 [name]

## 2022-07-20 NOTE — Anesthesia Preprocedure Evaluation (Addendum)
Anesthesia Evaluation  Patient identified by MRN, date of birth, ID band Patient awake    Reviewed: Allergy & Precautions, NPO status , Patient's Chart, lab work & pertinent test results, reviewed documented beta blocker date and time   History of Anesthesia Complications Negative for: history of anesthetic complications  Airway Mallampati: I  TM Distance: >3 FB Neck ROM: Full    Dental  (+) Dental Advisory Given, Teeth Intact   Pulmonary sleep apnea and Continuous Positive Airway Pressure Ventilation , former smoker   Pulmonary exam normal        Cardiovascular hypertension, Pt. on home beta blockers and Pt. on medications Normal cardiovascular exam     Neuro/Psych CVA, Residual Symptoms  negative psych ROS   GI/Hepatic Neg liver ROS,GERD  Medicated and Controlled,,  Endo/Other   Pre-DM   Renal/GU Renal InsufficiencyRenal disease     Musculoskeletal  (+) Arthritis ,    Abdominal   Peds  Hematology  On aspirin '325mg'$     Anesthesia Other Findings Vocal cord mass   Reproductive/Obstetrics                             Anesthesia Physical Anesthesia Plan  ASA: 3  Anesthesia Plan: General   Post-op Pain Management: Tylenol PO (pre-op)*   Induction: Intravenous  PONV Risk Score and Plan: 2 and Treatment may vary due to age or medical condition, Ondansetron and Dexamethasone  Airway Management Planned: Oral ETT and Video Laryngoscope Planned  Additional Equipment: None  Intra-op Plan:   Post-operative Plan: Extubation in OR  Informed Consent: I have reviewed the patients History and Physical, chart, labs and discussed the procedure including the risks, benefits and alternatives for the proposed anesthesia with the patient or authorized representative who has indicated his/her understanding and acceptance.     Dental advisory given  Plan Discussed with: CRNA and  Anesthesiologist  Anesthesia Plan Comments: (Plan for 6.36m laser-safe ETT )       Anesthesia Quick Evaluation

## 2022-07-20 NOTE — Transfer of Care (Signed)
Immediate Anesthesia Transfer of Care Note  Patient: [name]  Procedure(s) Performed: MICROLARYNGOSCOPY WITH CO2 LASER AND EXCISION OF VOCAL CORD LESION (Bilateral: Throat)  Patient Location: PACU  Anesthesia Type:General  Level of Consciousness: awake, alert , and oriented  Airway & Oxygen Therapy: Patient Spontanous Breathing and Patient connected to face mask oxygen  Post-op Assessment: Report given to RN and Post -op Vital signs reviewed and stable  Post vital signs: Reviewed and stable  Last Vitals:  Vitals Value Taken Time  BP 132/73 07/20/22 1100  Temp    Pulse 71 07/20/22 1101  Resp 18 07/20/22 1101  SpO2 97 % 07/20/22 1101  Vitals shown include unvalidated device data.  Last Pain:  Vitals:   07/20/22 0952  TempSrc: Oral  PainSc: 0-No pain      Patients Stated Pain Goal: 0 (68/08/81 1031)  Complications: No notable events documented.

## 2022-07-20 NOTE — Op Note (Signed)
PREOPERATIVE DIAGNOSIS:  Hoarseness and bilateral vocal cord lesion   POSTOPERATIVE DIAGNOSIS:  Hoarseness and bilateral vocal cord lesion   PROCEDURE:  Suspended microdirect laryngoscopy with CO2 laser excision of bilateral vocal fold lesion   SURGEON:  Melida Quitter, MD   ANESTHESIA:  General   COMPLICATIONS:  None.   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  He presents to the operating room for surgical management.   FINDINGS:  Left larger than right anterior vocal fold polyps   DESCRIPTION OF PROCEDURE:  The patient was identified in the holding room, informed consent having been obtained including discussion of risks, benefits and alternatives, the patient was brought to the operative suite and put the operative table in  supine position.  Anesthesia was induced and the patient was intubated by the Anesthesia team with a laser safe tube without difficulty.  The eyes were taped closed and bed was turned 90 degrees from anesthesia.  The patient was given intravenous steroids during the case.  A tooth guard was placed over the upper teeth and a Dedo laryngoscope was placed into the supraglottic position and suspended to Mayo stand using the Lewy arm.  Damp eye pads were taped over the eyes and a damp towel was placed over the face.  Photodocumentation was performed with the zero degree telescope.  An epinephrine-soaked pledget was held against the lesion for a minute or so.  The operating microscope was brought into the field and was used to evaluate the larynx.  The right-sided lesion was grasped with a cup forceps and retracted medially while an incision was made along the natural contour of the vocal fold using the CO2 laser on a setting of 4 watts, removing the lesion.  It was passed to nursing for pathology.  The same was then performed for the left lesion.  An epinephrine-soaked  pledget was again held against the site for a minute or so.  Photodocumentation was repeated.  The larynx was sprayed with topical lidocaine.  The laryngoscope was then taking out of suspension and removed from the patient's mouth while suctioning the airway.  The tooth guard was removed and the patient was turned back to anesthesia for wakeup, was extubated, and taken to the recovery room in stable condition.

## 2022-07-21 ENCOUNTER — Encounter (HOSPITAL_BASED_OUTPATIENT_CLINIC_OR_DEPARTMENT_OTHER): Payer: Self-pay | Admitting: Otolaryngology

## 2022-07-23 LAB — SURGICAL PATHOLOGY, GROSS ONLY (NOT ARMC)

## 2022-08-01 DIAGNOSIS — G4733 Obstructive sleep apnea (adult) (pediatric): Secondary | ICD-10-CM | POA: Diagnosis not present

## 2022-08-02 DIAGNOSIS — J383 Other diseases of vocal cords: Secondary | ICD-10-CM | POA: Diagnosis not present

## 2022-08-15 DIAGNOSIS — G4733 Obstructive sleep apnea (adult) (pediatric): Secondary | ICD-10-CM | POA: Diagnosis not present

## 2022-11-01 DIAGNOSIS — R0981 Nasal congestion: Secondary | ICD-10-CM | POA: Diagnosis not present

## 2022-11-14 DIAGNOSIS — G4733 Obstructive sleep apnea (adult) (pediatric): Secondary | ICD-10-CM | POA: Diagnosis not present

## 2023-01-22 DIAGNOSIS — R070 Pain in throat: Secondary | ICD-10-CM | POA: Diagnosis not present

## 2023-03-21 DIAGNOSIS — R070 Pain in throat: Secondary | ICD-10-CM | POA: Diagnosis not present

## 2023-05-02 DIAGNOSIS — I10 Essential (primary) hypertension: Secondary | ICD-10-CM | POA: Diagnosis not present

## 2023-05-02 DIAGNOSIS — M109 Gout, unspecified: Secondary | ICD-10-CM | POA: Diagnosis not present

## 2023-05-02 DIAGNOSIS — E1121 Type 2 diabetes mellitus with diabetic nephropathy: Secondary | ICD-10-CM | POA: Diagnosis not present

## 2023-05-02 DIAGNOSIS — E1122 Type 2 diabetes mellitus with diabetic chronic kidney disease: Secondary | ICD-10-CM | POA: Diagnosis not present

## 2023-05-02 DIAGNOSIS — D696 Thrombocytopenia, unspecified: Secondary | ICD-10-CM | POA: Diagnosis not present

## 2023-05-02 DIAGNOSIS — N183 Chronic kidney disease, stage 3 unspecified: Secondary | ICD-10-CM | POA: Diagnosis not present

## 2023-05-02 DIAGNOSIS — E559 Vitamin D deficiency, unspecified: Secondary | ICD-10-CM | POA: Diagnosis not present

## 2023-05-02 DIAGNOSIS — Z79899 Other long term (current) drug therapy: Secondary | ICD-10-CM | POA: Diagnosis not present

## 2023-05-02 DIAGNOSIS — I700 Atherosclerosis of aorta: Secondary | ICD-10-CM | POA: Diagnosis not present

## 2023-05-02 DIAGNOSIS — I672 Cerebral atherosclerosis: Secondary | ICD-10-CM | POA: Diagnosis not present

## 2023-05-02 DIAGNOSIS — E782 Mixed hyperlipidemia: Secondary | ICD-10-CM | POA: Diagnosis not present

## 2023-05-02 DIAGNOSIS — R202 Paresthesia of skin: Secondary | ICD-10-CM | POA: Diagnosis not present

## 2023-05-02 DIAGNOSIS — Z0000 Encounter for general adult medical examination without abnormal findings: Secondary | ICD-10-CM | POA: Diagnosis not present

## 2023-05-21 DIAGNOSIS — R070 Pain in throat: Secondary | ICD-10-CM | POA: Diagnosis not present

## 2023-05-21 DIAGNOSIS — J383 Other diseases of vocal cords: Secondary | ICD-10-CM | POA: Diagnosis not present

## 2023-05-21 HISTORY — PX: OTHER SURGICAL HISTORY: SHX169

## 2023-08-07 DIAGNOSIS — G4733 Obstructive sleep apnea (adult) (pediatric): Secondary | ICD-10-CM | POA: Diagnosis not present

## 2023-08-23 ENCOUNTER — Other Ambulatory Visit: Payer: Self-pay | Admitting: Otolaryngology

## 2023-08-27 ENCOUNTER — Other Ambulatory Visit: Payer: Self-pay

## 2023-08-27 ENCOUNTER — Encounter (HOSPITAL_COMMUNITY): Payer: Self-pay | Admitting: Otolaryngology

## 2023-08-27 NOTE — Progress Notes (Signed)
 PCP - Dr Dorn [facility] Cardiologist - none  Chest x-ray - 05/08/11 EKG - 07/18/22 Stress Test - 12/31/14 ECHO - 12/31/14 Cardiac Cath - n/a  ICD Pacemaker/Loop - n/a  Sleep Study -  Yes CPAP - uses nightly  Diabetes - n/a  Blood Thinner Instructions:  n/a  Aspirin  Instructions: Follow your surgeon's instructions on when to stop aspirin  prior to surgery,  If no instructions were given by your surgeon then you will need to call the office for those instructions.    ERAS - Clear liquids til 9:30 AM DOS.  Anesthesia review: Yes  STOP now taking any Aspirin  (unless otherwise instructed by your surgeon), Aleve, Naproxen, Ibuprofen, Motrin, Advil, Goody's, BC's, all herbal medications, fish oil, and all vitamins.   Coronavirus Screening Do you have any of the following symptoms:  Cough yes/no: No Fever (>100.62F)  yes/no: No Runny nose yes/no: No Sore throat yes/no: No Difficulty breathing/shortness of breath  yes/no: No  Have you traveled in the last 14 days and where? yes/no: No  Patient verbalized understanding of instructions that were given via phone.

## 2023-08-28 ENCOUNTER — Encounter (HOSPITAL_COMMUNITY): Payer: Self-pay | Admitting: Otolaryngology

## 2023-08-28 NOTE — Progress Notes (Signed)
 Anesthesia Chart Review: [name]  Case: [id number] Date/Time: 08/29/23 1212   Procedure: SUSPENSION MICRO DIRECT LARYNGOSCOPY WITH CO2 LASER AND EXCISION LESION WITH JET VENTILATION   Anesthesia type: General   Pre-op diagnosis: Vocal cord mass   [facility] ROOM [facility] / [facility]   Surgeons: Carlie Clark, MD       DISCUSSION: Patient is a 78 year old male scheduled for the above procedure. Per ENT notes, patient this throat pain and laryngeal ulcer. Reflux therapy did not relieve his symptoms, so above procedure recommended.   History includes former smoker, HTN, HLD, CVA (1997, slight residual left hemiparesis), OSA (uses CPAP), CKD, prediabetes, PTSD, vocal cord lesion.  He is a same day work-up, so labs and EKG as indicated on arrival.  Anesthesia team to evaluate on the day of surgery.  VS: Ht 5' 5 (1.651 m)   Wt 86.2 kg   BMI 31.62 kg/m  BP 134/72, on 08/07/23 [name])   PROVIDERS: Charlott Dorn LABOR, MD is PCP. He also receives some care through the [facility] ([place] [facility]) by Vinie Laurel, MD  - He is not routinely followed by cardiology but was evaluated in 2016 by Pietro Rogue, MD for palpitations and chest pain.  April 2016 Holter monitor showed sinus rhythm with PACs, nonconducted PACs and PVCs.  Echocardiogram showed normal LV function, grade 1 diastolic dysfunction, ETT was negative. As needed follow-up recommended.   LABS: For day of surgery as indicated. PEr 03/27/23 labs at [facility] BUN 18, glucose 94, Na 142, K 3.9, eGRF 46, Cr 1.54, H/H 12.4/37.2, PLT 180. A1c 6.6% on 03/19/23.    EKG: Last EKG noted from 07/18/22 showed: Sinus bradycardia with 1st degree A-V block Otherwise normal ECG When compared with ECG of 08-May-2011 09:11, PREVIOUS ECG IS PRESENT First degree A-V block New since previous tracing Confirmed by Pietro Rogue ([id number]) on 07/18/2022 7:48:31 AM   CV: CT Cardiac Scoring (ordered by Dr. Ciro Bolder) 03/05/22: IMPRESSION: 1. Total  calcium  score of 177 is at percentile 45 for subjects of the same age, gender, and race/ethnicity. 2. Solid pulmonary nodule of the right middle lobe measuring 4 mm. No follow-up needed if patient is low-risk.This recommendation follows the consensus statement: Guidelines for Management of Incidental Pulmonary Nodules Detected on CT Images: From the Fleischner Society 2017; Radiology 2017; 284:228-243.  Echo 12/31/14: Study Conclusions  - Left ventricle: The cavity size was normal. Wall thickness was increased in a pattern of mild LVH. Systolic function was normal. The estimated ejection fraction was in the range of 60% to 65%. Doppler parameters are consistent with abnormal left ventricular relaxation (grade 1 diastolic dysfunction).   ETT 12/31/14: Negative adequate ETT.   48 Hour Holter monitor 12/13/14: Sinus rhythm with PACs.  Some nonconducted PACs and PVCs.   Past Medical History:  Diagnosis Date   Anal fissure    Arthritis    hands,knees, ankles   CVA (cerebral infarction)    GERD (gastroesophageal reflux disease)    Hemorrhoids    Hyperlipidemia    Hypertension    Lesion of vocal cord    Pre-diabetes    diet controlled   PTSD (post-traumatic stress disorder)    Sinus drainage    post nasal drip from sinuses occasional.   Sleep apnea    uses CPAP   Stroke ([facility]) 1997   slight weakness left side-not a problem.    Past Surgical History:  Procedure Laterality Date   ANAL FISSURE REPAIR  05/11/2011   COLONOSCOPY  WITH PROPOFOL  N/A 02/06/2016   Procedure: COLONOSCOPY WITH PROPOFOL ;  Surgeon: Gladis MARLA Louder, MD;  Location: [facility];  Service: Endoscopy;  Laterality: N/A;   HEMORRHOID SURGERY     HERNIA REPAIR     Laryngoscopy Flexible Diagnostic  05/21/2023   in office w/dr bates with numbing medicine, no anesthesia   MICROLARYNGOSCOPY WITH CO2 LASER AND EXCISION OF VOCAL CORD LESION Bilateral 07/20/2022   Procedure: MICROLARYNGOSCOPY WITH CO2 LASER AND EXCISION  OF VOCAL CORD LESION;  Surgeon: Carlie Clark, MD;  Location: [facility];  Service: ENT;  Laterality: Bilateral;   ROTATOR CUFF REPAIR  08/20/2008   left    MEDICATIONS: No current facility-administered medications for this encounter.    aspirin  325 MG tablet   atorvastatin  (LIPITOR) 20 MG tablet   azelastine (ASTELIN) 0.1 % nasal spray   Cholecalciferol (VITAMIN D) 50 MCG (2000 UT) tablet   fluticasone (FLONASE) 50 MCG/ACT nasal spray   GARLIC PO   metoprolol  succinate (TOPROL -XL) 50 MG 24 hr tablet   montelukast (SINGULAIR) 10 MG tablet   omeprazole (PRILOSEC) 20 MG capsule   tamsulosin  (FLOMAX ) 0.4 MG CAPS capsule   valsartan-hydrochlorothiazide (DIOVAN-HCT) 160-25 MG tablet   COVID-19 mRNA Vac-TriS, Pfizer, (PFIZER-BIONT COVID-19 VAC-TRIS) SUSP injection  Advised to follow surgeon orders regarding ASA.   Isaiah Ruder, PA-C Surgical Short Stay/Anesthesiology [facility] Phone [phone] [facility] Phone [phone] 08/28/2023 11:54 AM

## 2023-08-28 NOTE — Anesthesia Preprocedure Evaluation (Addendum)
 Anesthesia Evaluation  Patient identified by MRN, date of birth, ID band Patient awake    Reviewed: Allergy & Precautions, H&P , NPO status , Patient's Chart, lab work & pertinent test results  Airway Mallampati: II  TM Distance: >3 FB Neck ROM: Full    Dental no notable dental hx. (+) Teeth Intact, Dental Advisory Given   Pulmonary shortness of breath, sleep apnea and Continuous Positive Airway Pressure Ventilation , former smoker   Pulmonary exam normal breath sounds clear to auscultation       Cardiovascular hypertension, Pt. on medications and Pt. on home beta blockers  Rhythm:Regular Rate:Normal     Neuro/Psych   Anxiety     CVA, Residual Symptoms    GI/Hepatic Neg liver ROS,GERD  Medicated,,  Endo/Other  negative endocrine ROS    Renal/GU Renal InsufficiencyRenal disease  negative genitourinary   Musculoskeletal  (+) Arthritis , Osteoarthritis,    Abdominal   Peds  Hematology negative hematology ROS (+)   Anesthesia Other Findings   Reproductive/Obstetrics negative OB ROS                             Anesthesia Physical Anesthesia Plan  ASA: 3  Anesthesia Plan: General   Post-op Pain Management:    Induction: Intravenous  PONV Risk Score and Plan: 3 and Ondansetron , Dexamethasone  and Treatment may vary due to age or medical condition  Airway Management Planned: Oral ETT  Additional Equipment:   Intra-op Plan:   Post-operative Plan: Extubation in OR  Informed Consent: I have reviewed the patients History and Physical, chart, labs and discussed the procedure including the risks, benefits and alternatives for the proposed anesthesia with the patient or authorized representative who has indicated his/her understanding and acceptance.     Dental advisory given  Plan Discussed with: CRNA  Anesthesia Plan Comments: (PAT note written 08/28/2023 by Allison Zelenak, PA-C.  )        Anesthesia Quick Evaluation

## 2023-08-29 ENCOUNTER — Other Ambulatory Visit: Payer: Self-pay

## 2023-08-29 ENCOUNTER — Ambulatory Visit (HOSPITAL_COMMUNITY): Payer: Medicare Other | Admitting: Vascular Surgery

## 2023-08-29 ENCOUNTER — Encounter (HOSPITAL_COMMUNITY)
Admission: RE | Disposition: A | Discharge status: Home / Self Care | Payer: Self-pay | Source: Home / Self Care | Attending: Otolaryngology

## 2023-08-29 ENCOUNTER — Ambulatory Visit (HOSPITAL_COMMUNITY)
Admission: RE | Admit: 2023-08-29 | Discharge: 2023-08-29 | Disposition: A | Discharge status: Home / Self Care | Payer: Medicare Other | Attending: Otolaryngology | Admitting: Otolaryngology

## 2023-08-29 ENCOUNTER — Encounter (HOSPITAL_COMMUNITY): Payer: Self-pay | Admitting: Otolaryngology

## 2023-08-29 DIAGNOSIS — F419 Anxiety disorder, unspecified: Secondary | ICD-10-CM | POA: Insufficient documentation

## 2023-08-29 DIAGNOSIS — F4310 Post-traumatic stress disorder, unspecified: Secondary | ICD-10-CM | POA: Insufficient documentation

## 2023-08-29 DIAGNOSIS — R7303 Prediabetes: Secondary | ICD-10-CM | POA: Diagnosis not present

## 2023-08-29 DIAGNOSIS — I129 Hypertensive chronic kidney disease with stage 1 through stage 4 chronic kidney disease, or unspecified chronic kidney disease: Secondary | ICD-10-CM | POA: Diagnosis not present

## 2023-08-29 DIAGNOSIS — R234 Changes in skin texture: Secondary | ICD-10-CM | POA: Insufficient documentation

## 2023-08-29 DIAGNOSIS — Z79899 Other long term (current) drug therapy: Secondary | ICD-10-CM | POA: Diagnosis not present

## 2023-08-29 DIAGNOSIS — J387 Other diseases of larynx: Secondary | ICD-10-CM | POA: Diagnosis not present

## 2023-08-29 DIAGNOSIS — Z87891 Personal history of nicotine dependence: Secondary | ICD-10-CM | POA: Insufficient documentation

## 2023-08-29 DIAGNOSIS — Z8673 Personal history of transient ischemic attack (TIA), and cerebral infarction without residual deficits: Secondary | ICD-10-CM | POA: Insufficient documentation

## 2023-08-29 DIAGNOSIS — I10 Essential (primary) hypertension: Secondary | ICD-10-CM

## 2023-08-29 DIAGNOSIS — G4730 Sleep apnea, unspecified: Secondary | ICD-10-CM | POA: Diagnosis not present

## 2023-08-29 DIAGNOSIS — N189 Chronic kidney disease, unspecified: Secondary | ICD-10-CM | POA: Insufficient documentation

## 2023-08-29 DIAGNOSIS — E785 Hyperlipidemia, unspecified: Secondary | ICD-10-CM | POA: Diagnosis not present

## 2023-08-29 DIAGNOSIS — K219 Gastro-esophageal reflux disease without esophagitis: Secondary | ICD-10-CM | POA: Diagnosis not present

## 2023-08-29 DIAGNOSIS — J383 Other diseases of vocal cords: Secondary | ICD-10-CM | POA: Insufficient documentation

## 2023-08-29 DIAGNOSIS — R070 Pain in throat: Secondary | ICD-10-CM | POA: Diagnosis not present

## 2023-08-29 HISTORY — DX: Chronic kidney disease, unspecified: N18.9

## 2023-08-29 HISTORY — DX: Post-traumatic stress disorder, unspecified: F43.10

## 2023-08-29 HISTORY — PX: MICROLARYNGOSCOPY WITH CO2 LASER AND EXCISION OF VOCAL CORD LESION: SHX5970

## 2023-08-29 LAB — CBC
HCT: 42.7 % (ref 39.0–52.0)
Hemoglobin: 13.4 g/dL (ref 13.0–17.0)
MCH: 26.8 pg (ref 26.0–34.0)
MCHC: 31.4 g/dL (ref 30.0–36.0)
MCV: 85.4 fL (ref 80.0–100.0)
Platelets: 152 10*3/uL (ref 150–400)
RBC: 5 MIL/uL (ref 4.22–5.81)
RDW: 13.5 % (ref 11.5–15.5)
WBC: 6.8 10*3/uL (ref 4.0–10.5)
nRBC: 0 % (ref 0.0–0.2)

## 2023-08-29 LAB — BASIC METABOLIC PANEL
Anion gap: 11 (ref 5–15)
BUN: 17 mg/dL (ref 8–23)
CO2: 24 mmol/L (ref 22–32)
Calcium: 9.4 mg/dL (ref 8.9–10.3)
Chloride: 104 mmol/L (ref 98–111)
Creatinine, Ser: 1.51 mg/dL — ABNORMAL HIGH (ref 0.61–1.24)
GFR, Estimated: 48 mL/min — ABNORMAL LOW (ref >60)
Glucose, Bld: 100 mg/dL — ABNORMAL HIGH (ref 70–99)
Potassium: 4 mmol/L (ref 3.5–5.1)
Sodium: 139 mmol/L (ref 135–145)

## 2023-08-29 SURGERY — MICROLARYNGOSCOPY WITH CO2 LASER AND EXCISION OF VOCAL CORD LESION
Anesthesia: General

## 2023-08-29 MED ORDER — FENTANYL CITRATE (PF) 250 MCG/5ML IJ SOLN
INTRAMUSCULAR | Status: DC | PRN
  Administered 2023-08-29 (×2): 50 ug via INTRAVENOUS

## 2023-08-29 MED ORDER — FENTANYL CITRATE (PF) 100 MCG/2ML IJ SOLN: 25.0000 ug | INTRAMUSCULAR | Status: DC | PRN

## 2023-08-29 MED ORDER — PROPOFOL 10 MG/ML IV BOLUS
INTRAVENOUS | Status: DC | PRN
  Administered 2023-08-29: 20 mg via INTRAVENOUS
  Administered 2023-08-29: 150 mg via INTRAVENOUS

## 2023-08-29 MED ORDER — FENTANYL CITRATE (PF) 250 MCG/5ML IJ SOLN
INTRAMUSCULAR | Status: AC
  Filled 2023-08-29: qty 5

## 2023-08-29 MED ORDER — EPHEDRINE 5 MG/ML INJ
INTRAVENOUS | Status: AC
Start: 2023-08-29 — End: ?
  Filled 2023-08-29: qty 5

## 2023-08-29 MED ORDER — PROPOFOL 10 MG/ML IV BOLUS
INTRAVENOUS | Status: AC
  Filled 2023-08-29: qty 20

## 2023-08-29 MED ORDER — DEXAMETHASONE SODIUM PHOSPHATE 10 MG/ML IJ SOLN
INTRAMUSCULAR | Status: AC
  Filled 2023-08-29: qty 1

## 2023-08-29 MED ORDER — EPINEPHRINE PF 1 MG/ML IJ SOLN
INTRAMUSCULAR | Status: DC | PRN
  Administered 2023-08-29: 10 mL via ENDOTRACHEOPULMONARY

## 2023-08-29 MED ORDER — LIDOCAINE 2% (20 MG/ML) 5 ML SYRINGE
INTRAMUSCULAR | Status: DC | PRN
  Administered 2023-08-29: 60 mg via INTRAVENOUS

## 2023-08-29 MED ORDER — STERILE WATER FOR IRRIGATION IR SOLN
Status: DC | PRN
  Administered 2023-08-29: 1000 mL

## 2023-08-29 MED ORDER — HEPARIN SODIUM (PORCINE) 1000 UNIT/ML IJ SOLN
INTRAMUSCULAR | Status: AC
  Filled 2023-08-29: qty 10

## 2023-08-29 MED ORDER — ONDANSETRON HCL 4 MG/2ML IJ SOLN
INTRAMUSCULAR | Status: AC
  Filled 2023-08-29: qty 2

## 2023-08-29 MED ORDER — CALCIUM CHLORIDE 10 % IV SOLN
INTRAVENOUS | Status: AC
  Filled 2023-08-29: qty 10

## 2023-08-29 MED ORDER — GLYCOPYRROLATE PF 0.2 MG/ML IJ SOSY
PREFILLED_SYRINGE | INTRAMUSCULAR | Status: AC
  Filled 2023-08-29: qty 1

## 2023-08-29 MED ORDER — PROTAMINE SULFATE 10 MG/ML IV SOLN
INTRAVENOUS | Status: AC
  Filled 2023-08-29: qty 25

## 2023-08-29 MED ORDER — ROCURONIUM BROMIDE 10 MG/ML (PF) SYRINGE
PREFILLED_SYRINGE | INTRAVENOUS | Status: AC
  Filled 2023-08-29: qty 10

## 2023-08-29 MED ORDER — CHLORHEXIDINE GLUCONATE 0.12 % MT SOLN
OROMUCOSAL | Status: AC
  Administered 2023-08-29: 15 mL via OROMUCOSAL
  Filled 2023-08-29: qty 15

## 2023-08-29 MED ORDER — EPINEPHRINE HCL (NASAL) 0.1 % NA SOLN
NASAL | Status: AC
  Filled 2023-08-29: qty 90

## 2023-08-29 MED ORDER — ONDANSETRON HCL 4 MG/2ML IJ SOLN
INTRAMUSCULAR | Status: AC
Start: 2023-08-29 — End: ?
  Filled 2023-08-29: qty 2

## 2023-08-29 MED ORDER — ORAL CARE MOUTH RINSE: 15.0000 mL | Freq: Once | OROMUCOSAL | Status: AC

## 2023-08-29 MED ORDER — PHENYLEPHRINE 80 MCG/ML (10ML) SYRINGE FOR IV PUSH (FOR BLOOD PRESSURE SUPPORT)
PREFILLED_SYRINGE | INTRAVENOUS | Status: AC
Start: 2023-08-29 — End: ?
  Filled 2023-08-29: qty 10

## 2023-08-29 MED ORDER — GLYCOPYRROLATE PF 0.2 MG/ML IJ SOSY
PREFILLED_SYRINGE | INTRAMUSCULAR | Status: DC | PRN
  Administered 2023-08-29: .2 mg via INTRAVENOUS

## 2023-08-29 MED ORDER — SODIUM CHLORIDE 0.9 % IV SOLN: INTRAVENOUS | Status: DC

## 2023-08-29 MED ORDER — ROCURONIUM BROMIDE 10 MG/ML (PF) SYRINGE
PREFILLED_SYRINGE | INTRAVENOUS | Status: DC | PRN
  Administered 2023-08-29: 40 mg via INTRAVENOUS

## 2023-08-29 MED ORDER — SUGAMMADEX SODIUM 200 MG/2ML IV SOLN
INTRAVENOUS | Status: DC | PRN
  Administered 2023-08-29 (×4): 200 mg via INTRAVENOUS

## 2023-08-29 MED ORDER — PROPOFOL 500 MG/50ML IV EMUL
INTRAVENOUS | Status: DC | PRN
  Administered 2023-08-29: 125 ug/kg/min via INTRAVENOUS

## 2023-08-29 MED ORDER — CHLORHEXIDINE GLUCONATE 0.12 % MT SOLN: 15.0000 mL | Freq: Once | OROMUCOSAL | Status: AC

## 2023-08-29 MED ORDER — ONDANSETRON HCL 4 MG/2ML IJ SOLN
INTRAMUSCULAR | Status: DC | PRN
  Administered 2023-08-29: 4 mg via INTRAVENOUS

## 2023-08-29 MED ORDER — DEXAMETHASONE SODIUM PHOSPHATE 10 MG/ML IJ SOLN
INTRAMUSCULAR | Status: DC | PRN
  Administered 2023-08-29: 10 mg via INTRAVENOUS

## 2023-08-29 SURGICAL SUPPLY — 30 items
BAG COUNTER SPONGE SURGICOUNT (BAG) ×2 IMPLANT
BALLN PULM 12 13.5 15X75 (BALLOONS) IMPLANT
BALLN PULMONARY 10-12 (MISCELLANEOUS) IMPLANT
BALLOON PULM 12 13.5 15X75 (BALLOONS) IMPLANT
BLADE SURG 15 STRL LF DISP TIS (BLADE) IMPLANT
BNDG EYE OVAL 2 1/8 X 2 5/8 (GAUZE/BANDAGES/DRESSINGS) ×4 IMPLANT
CANISTER SUCT 3000ML PPV (MISCELLANEOUS) ×2 IMPLANT
CNTNR URN SCR LID CUP LEK RST (MISCELLANEOUS) IMPLANT
COVER BACK TABLE 60X90IN (DRAPES) ×2 IMPLANT
COVER MAYO STAND STRL (DRAPES) ×2 IMPLANT
DRAPE HALF SHEET 40X57 (DRAPES) ×2 IMPLANT
DRSG TELFA 3X8 NADH STRL (GAUZE/BANDAGES/DRESSINGS) IMPLANT
GAUZE SPONGE 4X4 12PLY STRL (GAUZE/BANDAGES/DRESSINGS) ×2 IMPLANT
GLOVE BIO SURGEON STRL SZ7.5 (GLOVE) ×2 IMPLANT
GOWN STRL REUS W/ TWL LRG LVL3 (GOWN DISPOSABLE) IMPLANT
KIT BASIN OR (CUSTOM PROCEDURE TRAY) ×2 IMPLANT
KIT TURNOVER KIT B (KITS) ×2 IMPLANT
NDL HYPO 25GX1X1/2 BEV (NEEDLE) IMPLANT
NEEDLE HYPO 25GX1X1/2 BEV (NEEDLE) IMPLANT
NS IRRIG 1000ML POUR BTL (IV SOLUTION) ×2 IMPLANT
PAD ARMBOARD 7.5X6 YLW CONV (MISCELLANEOUS) ×4 IMPLANT
PATTIES SURGICAL .5 X1 (DISPOSABLE) ×2 IMPLANT
PATTIES SURGICAL .5 X3 (DISPOSABLE) ×2 IMPLANT
POSITIONER HEAD DONUT 9IN (MISCELLANEOUS) IMPLANT
SOL ANTI FOG 6CC (MISCELLANEOUS) ×2 IMPLANT
SURGILUBE 2OZ TUBE FLIPTOP (MISCELLANEOUS) IMPLANT
SUT SILK 2 0 PERMA HAND 18 BK (SUTURE) IMPLANT
TOWEL GREEN STERILE FF (TOWEL DISPOSABLE) ×2 IMPLANT
TUBE CONNECTING 12X1/4 (SUCTIONS) ×2 IMPLANT
WATER STERILE IRR 1000ML POUR (IV SOLUTION) ×2 IMPLANT

## 2023-08-29 NOTE — Anesthesia Postprocedure Evaluation (Signed)
 Anesthesia Post Note  Patient: [name]  Procedure(s) Performed: SUSPENSION MICRO DIRECT LARYNGOSCOPY WITH  biopsy     Patient location during evaluation: PACU Anesthesia Type: General Level of consciousness: awake and alert Pain management: pain level controlled Vital Signs Assessment: post-procedure vital signs reviewed and stable Respiratory status: spontaneous breathing, nonlabored ventilation and respiratory function stable Cardiovascular status: blood pressure returned to baseline and stable Postop Assessment: no apparent nausea or vomiting Anesthetic complications: no  No notable events documented.  Last Vitals:  Vitals:   08/29/23 1430 08/29/23 1445  BP: 136/87 (!) 149/88  Pulse: 86 78  Resp: 20 18  Temp:  36.7 C  SpO2: 93% 91%    Last Pain:  Vitals:   08/29/23 1445  PainSc: 2                  Dezirae Service,W. EDMOND

## 2023-08-29 NOTE — Op Note (Signed)
 PREOPERATIVE DIAGNOSIS:  Laryngeal lesion and pain   POSTOPERATIVE DIAGNOSIS:  Laryngeal lesion and pain   PROCEDURE:  Suspended microdirect laryngoscopy with biopsy   SURGEON:  Vaughan Ricker, MD   ANESTHESIA:  General with jet ventilation by anesthesia.   COMPLICATIONS:  None.   INDICATIONS:  The patient is a 78 year old male with a history of lower throat pain with a white area seen on flexible laryngoscopy.  He presents to the operating room for surgical management.   FINDINGS:  No lesion visible on laryngoscopy today.  Some posterior commissure hypertrophy.  Biopsies taken from each side of the posterior commissure.   DESCRIPTION OF PROCEDURE:  The patient was identified in the holding room, informed consent having been obtained including discussion of risks, benefits and alternatives, the patient was brought to the operative suite and put the operative table in the supine position.  Anesthesia was induced and the patient was maintained via mask ventilation.  The eyes were taped closed and bed was turned 90 degrees from anesthesia.  The patient was given intravenous steroids during the case.  A tooth guard was placed over the upper teeth and a Stortz laryngoscope was placed into the supraglottic position and suspended to Mayo stand using the Lewy arm.  Jet ventilation was initiated.  Damp eye pads were taped over the eyes and a damp towel was placed over the face.  An epinephrine -soaked pledget was held against the posterior commissure for a minute or so while holding ventilation.  The operating microscope was brought into the field and was used to evaluate the larynx.  Each side of the posterior commissure, near the vocal process, was biopsied using cup forceps.  Separate specimens were sent for pathology.  These were passed to nursing for pathology.  The laryngoscope was then taking out of suspension and used to carefully examine the remainder of the larynx and pharynx while suctioning the  airway.  The tooth guard was removed and the patient was turned back to anesthesia for wakeup and taken to the recovery room in stable condition.

## 2023-08-29 NOTE — Brief Op Note (Signed)
 08/29/2023  2:03 PM  PATIENT:  [name]  78 y.o. male  PRE-OPERATIVE DIAGNOSIS:  Laryngeal lesion  POST-OPERATIVE DIAGNOSIS:  Laryngeal lesion  PROCEDURE:  Procedure(s): SUSPENSION MICRO DIRECT LARYNGOSCOPY WITH  biopsy (N/A)  SURGEON:  Surgeons and Role:    DEWAINE Carlie Clark, MD - Primary  PHYSICIAN ASSISTANT:   ASSISTANTS: none   ANESTHESIA:   general  EBL:  Minimal   BLOOD ADMINISTERED:none  DRAINS: none   LOCAL MEDICATIONS USED:  NONE  SPECIMEN:  Source of Specimen:  Left and right posterior commissure biopsies.  DISPOSITION OF SPECIMEN:  PATHOLOGY  COUNTS:  YES  TOURNIQUET:  * No tourniquets in log *  DICTATION: .Note written in EPIC  PLAN OF CARE: Discharge to home after PACU  PATIENT DISPOSITION:  PACU - hemodynamically stable.   Delay start of Pharmacological VTE agent (>24hrs) due to surgical blood loss or risk of bleeding: no

## 2023-08-29 NOTE — Transfer of Care (Signed)
 Immediate Anesthesia Transfer of Care Note  Patient: [name]  Procedure(s) Performed: SUSPENSION MICRO DIRECT LARYNGOSCOPY WITH  biopsy  Patient Location: PACU  Anesthesia Type:General  Level of Consciousness: awake, drowsy, and patient cooperative  Airway & Oxygen Therapy: Patient Spontanous Breathing and Patient connected to nasal cannula oxygen  Post-op Assessment: Report given to RN and Post -op Vital signs reviewed and stable  Post vital signs: Reviewed and stable  Last Vitals:  Vitals Value Taken Time  BP 124/78 08/29/23 1418  Temp 36.5 C 08/29/23 1418  Pulse 84 08/29/23 1427  Resp 17 08/29/23 1427  SpO2 93 % 08/29/23 1427  Vitals shown include unfiled device data.  Last Pain:  Vitals:   08/29/23 1142  PainSc: 0-No pain         Complications: No notable events documented.

## 2023-08-29 NOTE — H&P (Signed)
 [name] is an 78 y.o. male.   Chief Complaint: Laryngeal lesion HPI: 78 year old male with long history of left-sided throat pain and was found to have a lesion in the left endolarynx.  He presents for microlaryngoscopy.  Past Medical History:  Diagnosis Date   Anal fissure    Arthritis    hands,knees, ankles   CKD (chronic kidney disease)    CVA (cerebral infarction)    GERD (gastroesophageal reflux disease)    Hemorrhoids    Hyperlipidemia    Hypertension    Lesion of vocal cord    Pre-diabetes    diet controlled   PTSD (post-traumatic stress disorder)    Sinus drainage    post nasal drip from sinuses occasional.   Sleep apnea    uses CPAP   Stroke (HCC) 1997   slight weakness left side-not a problem.    Past Surgical History:  Procedure Laterality Date   ANAL FISSURE REPAIR  05/11/2011   COLONOSCOPY WITH PROPOFOL  N/A 02/06/2016   Procedure: COLONOSCOPY WITH PROPOFOL ;  Surgeon: Gladis MARLA Louder, MD;  Location: [facility];  Service: Endoscopy;  Laterality: N/A;   HEMORRHOID SURGERY     HERNIA REPAIR     Laryngoscopy Flexible Diagnostic  05/21/2023   in office w/dr Taishawn Smaldone with numbing medicine, no anesthesia   MICROLARYNGOSCOPY WITH CO2 LASER AND EXCISION OF VOCAL CORD LESION Bilateral 07/20/2022   Procedure: MICROLARYNGOSCOPY WITH CO2 LASER AND EXCISION OF VOCAL CORD LESION;  Surgeon: Carlie Clark, MD;  Location: [facility];  Service: ENT;  Laterality: Bilateral;   ROTATOR CUFF REPAIR  08/20/2008   left    Family History  Problem Relation Age of Onset   Heart disease Mother        Died of MI at age 87   Cancer Father        stomach   Cancer Paternal Uncle        prostate   Social History:  reports that he quit smoking about 43 years ago. His smoking use included cigarettes. He has never used smokeless tobacco. He reports current drug use. Drug: Marijuana. He reports that he does not drink alcohol.  Allergies:  Allergies  Allergen  Reactions   Hydroxyzine Other (See Comments)    Reaction: Unknown   Sertraline Other (See Comments)    Other Reaction(s): MALE ERECTILE DISORDER    Medications Prior to Admission  Medication Sig Dispense Refill   aspirin  325 MG tablet Take 325 mg by mouth daily.       atorvastatin  (LIPITOR) 20 MG tablet Take 20 mg by mouth daily.     azelastine (ASTELIN) 0.1 % nasal spray Place 137 mcg into the nose daily as needed for allergies or rhinitis.     Cholecalciferol (VITAMIN D) 50 MCG (2000 UT) tablet Take 2,000 Units by mouth daily.     fluticasone (FLONASE) 50 MCG/ACT nasal spray Place 2 sprays into both nostrils daily as needed for allergies.     GARLIC PO Take 1 tablet by mouth daily.     metoprolol  succinate (TOPROL -XL) 50 MG 24 hr tablet Take 50 mg by mouth daily. Take with or immediately following a meal.     montelukast (SINGULAIR) 10 MG tablet Take 10 mg by mouth daily as needed (allergies).     omeprazole (PRILOSEC) 20 MG capsule Take 20 mg by mouth daily as needed (reflux).     valsartan-hydrochlorothiazide (DIOVAN-HCT) 160-25 MG tablet Take 0.5 tablets by mouth daily.  COVID-19 mRNA Vac-TriS, Pfizer, (PFIZER-BIONT COVID-19 VAC-TRIS) SUSP injection Inject into the muscle. 0.3 mL 0   tamsulosin  (FLOMAX ) 0.4 MG CAPS capsule Take 0.4 mg by mouth at bedtime.      Results for orders placed or performed during the hospital encounter of 08/29/23 (from the past 48 hours)  Basic metabolic panel per protocol     Status: Abnormal   Collection Time: 08/29/23 10:24 AM  Result Value Ref Range   Sodium 139 135 - 145 mmol/L   Potassium 4.0 3.5 - 5.1 mmol/L   Chloride 104 98 - 111 mmol/L   CO2 24 22 - 32 mmol/L   Glucose, Bld 100 (H) 70 - 99 mg/dL    Comment: Glucose reference range applies only to samples taken after fasting for at least 8 hours.   BUN 17 8 - 23 mg/dL   Creatinine, Ser 8.48 (H) 0.61 - 1.24 mg/dL   Calcium  9.4 8.9 - 10.3 mg/dL   GFR, Estimated 48 (L) >60 mL/min     Comment: (NOTE) Calculated using the CKD-EPI Creatinine Equation (2021)    Anion gap 11 5 - 15    Comment: Performed at [facility], [address], [place], [place] [address]  CBC per protocol     Status: None   Collection Time: 08/29/23 10:24 AM  Result Value Ref Range   WBC 6.8 4.0 - 10.5 K/uL   RBC 5.00 4.22 - 5.81 MIL/uL   Hemoglobin 13.4 13.0 - 17.0 g/dL   HCT 57.2 60.9 - 47.9 %   MCV 85.4 80.0 - 100.0 fL   MCH 26.8 26.0 - 34.0 pg   MCHC 31.4 30.0 - 36.0 g/dL   RDW 86.4 88.4 - 84.4 %   Platelets 152 150 - 400 K/uL   nRBC 0.0 0.0 - 0.2 %    Comment: Performed at [facility], [address], [place], [place] [address]   No results found.  Review of Systems  All other systems reviewed and are negative.   Blood pressure (!) 163/86, pulse (!) 56, temperature 98.6 F (37 C), resp. rate 18, height 5' 5 (1.651 m), weight 86.2 kg, SpO2 98%. Physical Exam Constitutional:      Appearance: Normal appearance. He is normal weight.  HENT:     Head: Normocephalic and atraumatic.     Right Ear: External ear normal.     Left Ear: External ear normal.     Nose: Nose normal.     Mouth/Throat:     Mouth: Mucous membranes are moist.     Pharynx: Oropharynx is clear.  Eyes:     Extraocular Movements: Extraocular movements intact.     Conjunctiva/sclera: Conjunctivae normal.     Pupils: Pupils are equal, round, and reactive to light.  Cardiovascular:     Rate and Rhythm: Normal rate.  Pulmonary:     Effort: Pulmonary effort is normal.  Musculoskeletal:     Cervical back: Normal range of motion.  Skin:    General: Skin is warm and dry.  Neurological:     General: No focal deficit present.     Mental Status: He is alert and oriented to person, place, and time.  Psychiatric:        Mood and Affect: Mood normal.        Behavior: Behavior normal.        Thought Content: Thought content normal.        Judgment: Judgment normal.  Assessment/Plan Laryngeal  lesion and pain  To OR for SMDL with CO2 laser biopsy versus excision.  Vaughan Ricker, MD 08/29/2023, 1:16 PM

## 2023-08-30 ENCOUNTER — Encounter (HOSPITAL_COMMUNITY): Payer: Self-pay | Admitting: Otolaryngology

## 2023-08-30 LAB — SURGICAL PATHOLOGY

## 2023-09-18 DIAGNOSIS — R070 Pain in throat: Secondary | ICD-10-CM | POA: Diagnosis not present

## 2023-09-27 ENCOUNTER — Other Ambulatory Visit: Payer: Self-pay | Admitting: Physician Assistant

## 2023-09-27 ENCOUNTER — Encounter (HOSPITAL_COMMUNITY): Payer: Self-pay | Admitting: Family Medicine

## 2023-09-27 ENCOUNTER — Emergency Department (HOSPITAL_COMMUNITY): Payer: Medicare Other

## 2023-09-27 ENCOUNTER — Inpatient Hospital Stay (HOSPITAL_COMMUNITY): Payer: Medicare Other

## 2023-09-27 ENCOUNTER — Observation Stay (HOSPITAL_COMMUNITY)
Admission: EM | Admit: 2023-09-27 | Discharge: 2023-09-28 | Disposition: A | Discharge status: Home / Self Care | Payer: Medicare Other | Attending: Internal Medicine | Admitting: Internal Medicine

## 2023-09-27 ENCOUNTER — Other Ambulatory Visit: Payer: Self-pay

## 2023-09-27 DIAGNOSIS — I771 Stricture of artery: Secondary | ICD-10-CM | POA: Diagnosis not present

## 2023-09-27 DIAGNOSIS — I129 Hypertensive chronic kidney disease with stage 1 through stage 4 chronic kidney disease, or unspecified chronic kidney disease: Secondary | ICD-10-CM | POA: Insufficient documentation

## 2023-09-27 DIAGNOSIS — K7030 Alcoholic cirrhosis of liver without ascites: Secondary | ICD-10-CM | POA: Diagnosis present

## 2023-09-27 DIAGNOSIS — M79606 Pain in leg, unspecified: Secondary | ICD-10-CM | POA: Diagnosis present

## 2023-09-27 DIAGNOSIS — R770 Abnormality of albumin: Secondary | ICD-10-CM | POA: Insufficient documentation

## 2023-09-27 DIAGNOSIS — I6389 Other cerebral infarction: Secondary | ICD-10-CM

## 2023-09-27 DIAGNOSIS — R200 Anesthesia of skin: Principal | ICD-10-CM | POA: Insufficient documentation

## 2023-09-27 DIAGNOSIS — Z8673 Personal history of transient ischemic attack (TIA), and cerebral infarction without residual deficits: Secondary | ICD-10-CM | POA: Diagnosis not present

## 2023-09-27 DIAGNOSIS — I6782 Cerebral ischemia: Secondary | ICD-10-CM | POA: Diagnosis not present

## 2023-09-27 DIAGNOSIS — N401 Enlarged prostate with lower urinary tract symptoms: Secondary | ICD-10-CM | POA: Diagnosis present

## 2023-09-27 DIAGNOSIS — I6523 Occlusion and stenosis of bilateral carotid arteries: Secondary | ICD-10-CM | POA: Diagnosis not present

## 2023-09-27 DIAGNOSIS — K219 Gastro-esophageal reflux disease without esophagitis: Secondary | ICD-10-CM | POA: Diagnosis present

## 2023-09-27 DIAGNOSIS — N1831 Chronic kidney disease, stage 3a: Secondary | ICD-10-CM | POA: Diagnosis present

## 2023-09-27 DIAGNOSIS — Z87891 Personal history of nicotine dependence: Secondary | ICD-10-CM | POA: Diagnosis not present

## 2023-09-27 DIAGNOSIS — R252 Cramp and spasm: Secondary | ICD-10-CM | POA: Insufficient documentation

## 2023-09-27 DIAGNOSIS — Z79899 Other long term (current) drug therapy: Secondary | ICD-10-CM | POA: Diagnosis not present

## 2023-09-27 DIAGNOSIS — I10 Essential (primary) hypertension: Secondary | ICD-10-CM | POA: Diagnosis present

## 2023-09-27 DIAGNOSIS — I639 Cerebral infarction, unspecified: Secondary | ICD-10-CM | POA: Diagnosis not present

## 2023-09-27 DIAGNOSIS — Z7982 Long term (current) use of aspirin: Secondary | ICD-10-CM | POA: Diagnosis not present

## 2023-09-27 DIAGNOSIS — E042 Nontoxic multinodular goiter: Secondary | ICD-10-CM | POA: Diagnosis not present

## 2023-09-27 DIAGNOSIS — G4733 Obstructive sleep apnea (adult) (pediatric): Secondary | ICD-10-CM

## 2023-09-27 DIAGNOSIS — E785 Hyperlipidemia, unspecified: Secondary | ICD-10-CM | POA: Diagnosis present

## 2023-09-27 DIAGNOSIS — I672 Cerebral atherosclerosis: Secondary | ICD-10-CM | POA: Diagnosis not present

## 2023-09-27 DIAGNOSIS — R41841 Cognitive communication deficit: Secondary | ICD-10-CM | POA: Diagnosis present

## 2023-09-27 LAB — DIFFERENTIAL
Abs Immature Granulocytes: 0.02 K/uL (ref 0.00–0.07)
Basophils Absolute: 0 K/uL (ref 0.0–0.1)
Basophils Relative: 0 %
Eosinophils Absolute: 0.3 K/uL (ref 0.0–0.5)
Eosinophils Relative: 5 %
Immature Granulocytes: 0 %
Lymphocytes Relative: 41 %
Lymphs Abs: 2.8 K/uL (ref 0.7–4.0)
Monocytes Absolute: 0.6 K/uL (ref 0.1–1.0)
Monocytes Relative: 9 %
Neutro Abs: 3 K/uL (ref 1.7–7.7)
Neutrophils Relative %: 45 %

## 2023-09-27 LAB — CBC
HCT: 38.5 % — ABNORMAL LOW (ref 39.0–52.0)
Hemoglobin: 12.3 g/dL — ABNORMAL LOW (ref 13.0–17.0)
MCH: 26.9 pg (ref 26.0–34.0)
MCHC: 31.9 g/dL (ref 30.0–36.0)
MCV: 84.2 fL (ref 80.0–100.0)
Platelets: 172 K/uL (ref 150–400)
RBC: 4.57 MIL/uL (ref 4.22–5.81)
RDW: 13.4 % (ref 11.5–15.5)
WBC: 6.7 K/uL (ref 4.0–10.5)
nRBC: 0 % (ref 0.0–0.2)

## 2023-09-27 LAB — APTT: aPTT: 28 s (ref 24–36)

## 2023-09-27 LAB — I-STAT CHEM 8, ED
BUN: 19 mg/dL (ref 8–23)
Calcium, Ion: 1.18 mmol/L (ref 1.15–1.40)
Chloride: 104 mmol/L (ref 98–111)
Creatinine, Ser: 1.7 mg/dL — ABNORMAL HIGH (ref 0.61–1.24)
Glucose, Bld: 113 mg/dL — ABNORMAL HIGH (ref 70–99)
HCT: 39 % (ref 39.0–52.0)
Hemoglobin: 13.3 g/dL (ref 13.0–17.0)
Potassium: 3.9 mmol/L (ref 3.5–5.1)
Sodium: 140 mmol/L (ref 135–145)
TCO2: 25 mmol/L (ref 22–32)

## 2023-09-27 LAB — COMPREHENSIVE METABOLIC PANEL WITH GFR
ALT: 34 U/L (ref 0–44)
AST: 29 U/L (ref 15–41)
Albumin: 3.4 g/dL — ABNORMAL LOW (ref 3.5–5.0)
Alkaline Phosphatase: 57 U/L (ref 38–126)
Anion gap: 10 (ref 5–15)
BUN: 17 mg/dL (ref 8–23)
CO2: 24 mmol/L (ref 22–32)
Calcium: 9.1 mg/dL (ref 8.9–10.3)
Chloride: 104 mmol/L (ref 98–111)
Creatinine, Ser: 1.54 mg/dL — ABNORMAL HIGH (ref 0.61–1.24)
GFR, Estimated: 46 mL/min — ABNORMAL LOW
Glucose, Bld: 117 mg/dL — ABNORMAL HIGH (ref 70–99)
Potassium: 3.8 mmol/L (ref 3.5–5.1)
Sodium: 138 mmol/L (ref 135–145)
Total Bilirubin: 0.8 mg/dL (ref 0.0–1.2)
Total Protein: 6.9 g/dL (ref 6.5–8.1)

## 2023-09-27 LAB — CBG MONITORING, ED: Glucose-Capillary: 109 mg/dL — ABNORMAL HIGH (ref 70–99)

## 2023-09-27 LAB — ECHOCARDIOGRAM COMPLETE
Area-P 1/2: 3.77 cm2
S' Lateral: 3.4 cm

## 2023-09-27 LAB — ETHANOL: Alcohol, Ethyl (B): <10 mg/dL (ref <10)

## 2023-09-27 LAB — LIPID PANEL
Cholesterol: 128 mg/dL (ref 0–200)
HDL: 39 mg/dL — ABNORMAL LOW (ref >40)
LDL Cholesterol: 77 mg/dL (ref 0–99)
Total CHOL/HDL Ratio: 3.3 {ratio}
Triglycerides: 58 mg/dL (ref <150)
VLDL: 12 mg/dL (ref 0–40)

## 2023-09-27 LAB — VITAMIN B12: Vitamin B-12: 280 pg/mL (ref 180–914)

## 2023-09-27 LAB — CK: Total CK: 226 U/L (ref 49–397)

## 2023-09-27 LAB — HEMOGLOBIN A1C
Hgb A1c MFr Bld: 6.5 % — ABNORMAL HIGH (ref 4.8–5.6)
Mean Plasma Glucose: 139.85 mg/dL

## 2023-09-27 LAB — TSH: TSH: 0.863 u[IU]/mL (ref 0.350–4.500)

## 2023-09-27 LAB — PROTIME-INR
INR: 1 (ref 0.8–1.2)
Prothrombin Time: 13.7 s (ref 11.4–15.2)

## 2023-09-27 MED ORDER — ASPIRIN 300 MG RE SUPP: 300.0000 mg | Freq: Every day | RECTAL | Status: DC

## 2023-09-27 MED ORDER — ATORVASTATIN CALCIUM 40 MG PO TABS: 20.0000 mg | ORAL_TABLET | Freq: Every day | ORAL | Status: DC

## 2023-09-27 MED ORDER — ENOXAPARIN SODIUM 40 MG/0.4ML IJ SOSY
40.0000 mg | PREFILLED_SYRINGE | Freq: Every day | INTRAMUSCULAR | Status: DC
  Administered 2023-09-27: 40 mg via SUBCUTANEOUS
  Filled 2023-09-27: qty 0.4

## 2023-09-27 MED ORDER — CLOPIDOGREL BISULFATE 75 MG PO TABS
75.0000 mg | ORAL_TABLET | Freq: Every day | ORAL | Status: DC
  Administered 2023-09-27 – 2023-09-28 (×2): 75 mg via ORAL
  Filled 2023-09-27 (×2): qty 1

## 2023-09-27 MED ORDER — ONDANSETRON HCL 4 MG/2ML IJ SOLN
4.0000 mg | Freq: Four times a day (QID) | INTRAMUSCULAR | Status: DC | PRN
Start: 2023-09-27 — End: 2023-09-28

## 2023-09-27 MED ORDER — TAMSULOSIN HCL 0.4 MG PO CAPS
0.4000 mg | ORAL_CAPSULE | Freq: Every day | ORAL | Status: DC
  Administered 2023-09-27: 0.4 mg via ORAL
  Filled 2023-09-27: qty 1

## 2023-09-27 MED ORDER — ACETAMINOPHEN 325 MG PO TABS: 650.0000 mg | ORAL_TABLET | Freq: Four times a day (QID) | ORAL | Status: DC | PRN

## 2023-09-27 MED ORDER — STROKE: EARLY STAGES OF RECOVERY BOOK
Freq: Once | Status: AC
  Filled 2023-09-27: qty 1

## 2023-09-27 MED ORDER — PANTOPRAZOLE SODIUM 20 MG PO TBEC
40.0000 mg | DELAYED_RELEASE_TABLET | Freq: Every day | ORAL | Status: DC
Start: 2023-09-27 — End: 2023-09-28
  Administered 2023-09-27 – 2023-09-28 (×2): 40 mg via ORAL
  Filled 2023-09-27 (×2): qty 2

## 2023-09-27 MED ORDER — LORAZEPAM 2 MG/ML IJ SOLN
2.0000 mg | Freq: Once | INTRAMUSCULAR | Status: AC
  Administered 2023-09-27: 2 mg via INTRAMUSCULAR

## 2023-09-27 MED ORDER — ACETAMINOPHEN 650 MG RE SUPP: 650.0000 mg | Freq: Four times a day (QID) | RECTAL | Status: DC | PRN

## 2023-09-27 MED ORDER — METOPROLOL TARTRATE 5 MG/5ML IV SOLN: 5.0000 mg | Freq: Four times a day (QID) | INTRAVENOUS | Status: DC | PRN

## 2023-09-27 MED ORDER — LORAZEPAM 2 MG/ML IJ SOLN
INTRAMUSCULAR | Status: AC
  Filled 2023-09-27: qty 1

## 2023-09-27 MED ORDER — ASPIRIN 81 MG PO TBEC
81.0000 mg | DELAYED_RELEASE_TABLET | Freq: Every day | ORAL | Status: DC
  Administered 2023-09-27 – 2023-09-28 (×2): 81 mg via ORAL
  Filled 2023-09-27 (×2): qty 1

## 2023-09-27 MED ORDER — SODIUM CHLORIDE 0.9% FLUSH: 3.0000 mL | Freq: Once | INTRAVENOUS | Status: DC

## 2023-09-27 MED ORDER — ONDANSETRON HCL 4 MG PO TABS: 4.0000 mg | ORAL_TABLET | Freq: Four times a day (QID) | ORAL | Status: DC | PRN

## 2023-09-27 MED ORDER — ATORVASTATIN CALCIUM 40 MG PO TABS
40.0000 mg | ORAL_TABLET | Freq: Every day | ORAL | Status: DC
Start: 2023-09-27 — End: 2023-09-28
  Administered 2023-09-28: 40 mg via ORAL
  Filled 2023-09-27: qty 1

## 2023-09-27 MED ORDER — IOHEXOL 350 MG/ML SOLN
100.0000 mL | Freq: Once | INTRAVENOUS | Status: AC | PRN
  Administered 2023-09-27: 100 mL via INTRAVENOUS

## 2023-09-27 NOTE — Assessment & Plan Note (Signed)
 Allow for permissive HTN of 220/120 for first 24 hours  Home medication includes diovan-hct and toprol -xl, hold for now  PRN IV medication for parameters above

## 2023-09-27 NOTE — ED Notes (Signed)
Pt transported from CT to MRI ?

## 2023-09-27 NOTE — ED Provider Notes (Signed)
 [place] [facility] Provider Note   CSN: [id number] Arrival date & time: 09/27/23  0209     History  Chief Complaint  Patient presents with   Code Stroke    Left Arm Numbness LSN 1 am     [name] is a 78 y.o. male.  78 yo M with a chief complaints of left-sided weakness.  Patient symptoms started acutely he was made a code stroke and taken urgently back to CT.  Level 5 caveat acuity of condition.        Home Medications Prior to Admission medications   Medication Sig Start Date End Date Taking? Authorizing Provider  aspirin  325 MG tablet Take 325 mg by mouth daily.      [provider]  atorvastatin  (LIPITOR) 20 MG tablet Take 20 mg by mouth daily.    [provider]  azelastine (ASTELIN) 0.1 % nasal spray Place 137 mcg into the nose daily as needed for allergies or rhinitis.    [provider]  Cholecalciferol (VITAMIN D) 50 MCG (2000 UT) tablet Take 2,000 Units by mouth daily.    [provider]  COVID-19 mRNA Vac-TriS, Pfizer, (PFIZER-BIONT COVID-19 VAC-TRIS) SUSP injection Inject into the muscle. 12/08/20   Luiz Channel, MD  fluticasone (FLONASE) 50 MCG/ACT nasal spray Place 2 sprays into both nostrils daily as needed for allergies. 07/14/19   [provider]  GARLIC PO Take 1 tablet by mouth daily.    [provider]  metoprolol  succinate (TOPROL -XL) 50 MG 24 hr tablet Take 50 mg by mouth daily. Take with or immediately following a meal.    [provider]  montelukast (SINGULAIR) 10 MG tablet Take 10 mg by mouth daily as needed (allergies). 03/19/23   [provider]  omeprazole (PRILOSEC) 20 MG capsule Take 20 mg by mouth daily as needed (reflux).    [provider]  tamsulosin  (FLOMAX ) 0.4 MG CAPS capsule Take 0.4 mg by mouth at bedtime.    [provider]  valsartan-hydrochlorothiazide (DIOVAN-HCT) 160-25 MG tablet Take 0.5 tablets by  mouth daily.    [provider]      Allergies    Hydroxyzine and Sertraline    Review of Systems   Review of Systems  Physical Exam Updated Vital Signs BP (!) 158/78   Pulse (!) 58   Temp 98.4 F (36.9 C)   Resp 12   SpO2 100%  Physical Exam Vitals and nursing note reviewed.  Constitutional:      Appearance: He is well-developed.  HENT:     Head: Normocephalic and atraumatic.  Eyes:     Pupils: Pupils are equal, round, and reactive to light.  Neck:     Vascular: No JVD.  Cardiovascular:     Rate and Rhythm: Normal rate and regular rhythm.     Heart sounds: No murmur heard.    No friction rub. No gallop.  Pulmonary:     Effort: No respiratory distress.     Breath sounds: No wheezing.  Abdominal:     General: There is no distension.     Tenderness: There is no abdominal tenderness. There is no guarding or rebound.  Musculoskeletal:        General: Normal range of motion.     Cervical back: Normal range of motion and neck supple.  Skin:    Coloration: Skin is not pale.     Findings: No rash.  Neurological:  Mental Status: He is alert and oriented to person, place, and time.     Comments: LUE and LLE weakness 4/5 compared to Right  Psychiatric:        Behavior: Behavior normal.     ED Results / Procedures / Treatments   Labs (all labs ordered are listed, but only abnormal results are displayed) Labs Reviewed  CBC - Abnormal; Notable for the following components:      Result Value   Hemoglobin 12.3 (*)    HCT 38.5 (*)    All other components within normal limits  COMPREHENSIVE METABOLIC PANEL - Abnormal; Notable for the following components:   Glucose, Bld 117 (*)    Creatinine, Ser 1.54 (*)    Albumin 3.4 (*)    GFR, Estimated 46 (*)    All other components within normal limits  CBG MONITORING, ED - Abnormal; Notable for the following components:   Glucose-Capillary 109 (*)    All other components within normal limits  I-STAT CHEM 8, ED -  Abnormal; Notable for the following components:   Creatinine, Ser 1.70 (*)    Glucose, Bld 113 (*)    All other components within normal limits  PROTIME-INR  APTT  DIFFERENTIAL  ETHANOL  CBG MONITORING, ED    EKG EKG Interpretation Date/Time:  Friday September 27 2023 03:29:54 EST Ventricular Rate:  56 PR Interval:  300 QRS Duration:  96 QT Interval:  436 QTC Calculation: 421 R Axis:   7  Text Interpretation: Sinus rhythm Prolonged PR interval Abnormal R-wave progression, early transition No significant change since last tracing Confirmed by Emil Share [id number]) on 09/27/2023 3:37:11 AM  Radiology CT ANGIO HEAD NECK W WO CM W PERF (CODE STROKE) Result Date: 09/27/2023 CLINICAL DATA:  78 year old male code stroke presentation. Subtle diffusion abnormality in the posterior right MCA territory on MRI this morning. EXAM: CT ANGIOGRAPHY HEAD AND NECK CT PERFUSION BRAIN TECHNIQUE: Multidetector CT imaging of the head and neck was performed using the standard protocol during bolus administration of intravenous contrast. Multiplanar CT image reconstructions and MIPs were obtained to evaluate the vascular anatomy. Carotid stenosis measurements (when applicable) are obtained utilizing NASCET criteria, using the distal internal carotid diameter as the denominator. Multiphase CT imaging of the brain was performed following IV bolus contrast injection. Subsequent parametric perfusion maps were calculated using RAPID software. RADIATION DOSE REDUCTION: This exam was performed according to the departmental dose-optimization program which includes automated exposure control, adjustment of the mA and/or kV according to patient size and/or use of iterative reconstruction technique. CONTRAST:  OMNIPAQUE  IOHEXOL  350 MG/ML SOLN COMPARISON:  Brain MRI 0258 hours.  Head CT 0228 hours. FINDINGS: CT Brain Perfusion Findings: ASPECTS: 10 at 0228 hours. CBF (<30%) Volume: 0mL Perfusion (Tmax>6.0s) volume: 0mL Mismatch  Volume: Not applicable Infarction Location:Not applicable No perfusion parameter abnormality is detected. CTA NECK Skeleton: No acute osseous abnormality identified. Ordinary cervical spine degeneration. Upper chest: Mild atelectasis. Other neck: Subcentimeter thyroid  nodules Not clinically significant; no follow-up imaging recommended (ref: J Am Coll Radiol. 2015 Feb;12(2): 143-50). Other neck soft tissue spaces are within normal limits. Aortic arch: 3 vessel arch. Mild arch tortuosity and calcified atherosclerosis. Generally tortuous proximal great vessels. Right carotid system: Tortuous brachiocephalic artery and proximal right CCA with no plaque or stenosis. Mild calcified more so than soft plaque at the right carotid bifurcation with no significant stenosis. Left carotid system: Mildly tortuous left CCA with no plaque or stenosis. Moderate soft more so than calcified plaque  at the posterior left ICA origin (series 6, image 103) and continues into the left ICA bulb (series 11, image 141). However, stenosis is less than 50 % with respect to the distal vessel. Distal to the bulb left ICA is negative to the skull base. Vertebral arteries: Tortuous right subclavian artery without plaque or stenosis. Mild calcified plaque adjacent to the right vertebral artery origin without stenosis on series 7, image 348. Right vertebral artery then patent and normal to the skull base. Tortuous proximal left subclavian artery and left vertebral artery origin without plaque or stenosis. Codominant left vertebral artery is patent and within normal limits to the skull base. CTA HEAD Posterior circulation: Fairly codominant distal vertebral arteries with bulky right V4 segment soft and calcified plaque resulting in moderate to severe stenosis on series 10, image 147. slightly diminished caliber of the vessel distal to that segment. Both PICA origins remain normal. Left V4 segment is patent with minimal plaque. Patent vertebrobasilar  junction without stenosis. Patent basilar artery with mild irregularity, mild plaque but no significant stenosis. Patent SCA and PCA origins. Posterior communicating arteries are diminutive or absent. Bilateral PCA branches are within normal limits. Anterior circulation: Both ICA siphons are patent. Left siphon calcified plaque maximal at the distal cavernous segment with only mild stenosis. Right siphon mild calcified plaque with mild stenosis. Patent carotid termini. Normal MCA and ACA origins. Mild M1 and A1 tortuosity. Diminutive or absent anterior communicating artery. Bilateral ACA branches are within normal limits. Left MCA M1 segment and bifurcation are patent without stenosis. Left MCA branches are within normal limits. Right MCA M1 segment and trifurcation are tortuous, patent without stenosis. Right MCA branches are within normal limits. No convincing distal branch occlusion. Venous sinuses: Patent. Anatomic variants: None. Review of the MIP images confirms the above findings IMPRESSION: 1. Negative CT Perfusion. CTA is negative for large vessel occlusion, with no convincing Right MCA branch occlusion. 2. Head and neck Atherosclerosis most pronounced at: - Left ICA origin/bulb with bulky soft plaque, but no significant stenosis. - codominant Right Vertebral Artery V4 segment, with moderate to Severe stenosis. 3. Generalized arterial tortuosity. Electronically Signed   By: VEAR Hurst M.D.   On: 09/27/2023 05:10   MR BRAIN WO CONTRAST Result Date: 09/27/2023 CLINICAL DATA:  Initial evaluation for acute neuro deficit, stroke suspected. EXAM: MRI HEAD WITHOUT CONTRAST TECHNIQUE: Multiplanar, multiecho pulse sequences of the brain and surrounding structures were obtained without intravenous contrast. COMPARISON:  CT from earlier the same day. FINDINGS: Brain: Cerebral volume within normal limits. Scattered patchy T2/FLAIR hyperintensity involving the periventricular deep white matter both cerebral hemispheres,  consistent with chronic small vessel ischemic disease, mild for age. Few remote lacunar infarcts present about the right basal ganglia. Subtle serpiginous focus of diffusion signal abnormality seen involving the cortical gray matter of the high posterior right parietal lobe, postcentral gyrus (series 5, image 7), suspicious for a small acute ischemic infarct. Probable additional subtle patchy involvement of the precentral gyrus as well (same image). Suspected tiny subcortical involvement noted inferiorly (series 5, image 12). These changes are not well seen on corresponding coronal sequence. No associated hemorrhage or mass effect. No other evidence for acute or subacute ischemia. Gray-white matter differentiation otherwise maintained. No acute intracranial hemorrhage. Single punctate chronic microhemorrhage noted at the posterior left cerebral hemisphere, likely small vessel related. No mass lesion, midline shift or mass effect. No hydrocephalus or extra-axial fluid collection. Pituitary gland and suprasellar region within normal limits. Vascular: Major intracranial vascular  flow voids are maintained. Skull and upper cervical spine: Craniocervical junction within normal limits. Bone marrow signal intensity normal. No scalp soft tissue abnormality. Sinuses/Orbits: Globes and orbital soft tissues within normal limits. Paranasal sinuses are largely clear. No significant mastoid effusion. Other: None. IMPRESSION: 1. Subtle foci of diffusion signal abnormality involving the posterior right frontoparietal region, pre and postcentral gyri, consistent with small acute ischemic nonhemorrhagic infarcts. 2. Underlying mild chronic microvascular ischemic disease with a few remote lacunar infarcts about the right basal ganglia. Electronically Signed   By: Morene Hoard M.D.   On: 09/27/2023 03:46   CT HEAD CODE STROKE WO CONTRAST Result Date: 09/27/2023 CLINICAL DATA:  Code stroke. Initial evaluation for acute stroke,  left-sided numbness. EXAM: CT HEAD WITHOUT CONTRAST TECHNIQUE: Contiguous axial images were obtained from the base of the skull through the vertex without intravenous contrast. RADIATION DOSE REDUCTION: This exam was performed according to the departmental dose-optimization program which includes automated exposure control, adjustment of the mA and/or kV according to patient size and/or use of iterative reconstruction technique. COMPARISON:  None Available. FINDINGS: Brain: Cerebral volume within normal limits. No acute intracranial hemorrhage. No acute large vessel territory infarct. No mass lesion or midline shift. No extra-axial fluid collection. Vascular: No convincing hyperdense vessel. Scattered vascular calcifications noted within the carotid siphons. Skull: Scalp soft tissues within normal limits for age calvarium intact. Sinuses/Orbits: Globes and orbital soft tissues within normal limits. Paranasal sinuses are clear. No mastoid effusion. Other: None. ASPECTS [facility] Stroke Program Early CT Score) - Ganglionic level infarction (caudate, lentiform nuclei, internal capsule, insula, M1-M3 cortex): 7 - Supraganglionic infarction (M4-M6 cortex): 3 Total score (0-10 with 10 being normal): 10 IMPRESSION: 1. Negative head CT.  No acute intracranial abnormality. 2. ASPECTS is 10. These results were communicated to Dr. Arora at 2:38 am on 09/27/2023 by text page via the [facility] messaging system. Electronically Signed   By: Morene Hoard M.D.   On: 09/27/2023 02:38    Procedures Procedures    Medications Ordered in ED Medications  sodium chloride  flush (NS) 0.9 % injection 3 mL (0 mLs Intravenous Hold 09/27/23 0301)  LORazepam  (ATIVAN ) injection 2 mg (2 mg Intramuscular Given 09/27/23 0249)  iohexol  (OMNIPAQUE ) 350 MG/ML injection 100 mL (100 mLs Intravenous Contrast Given 09/27/23 0443)    ED Course/ Medical Decision Making/ A&P                                 Medical Decision Making  78 yo M with a  chief complaints of left-sided weakness.  Patient was made a code stroke in triage and was taken rapidly back to CT.  CT of the head without obvious intracranial hemorrhage.  Neurology recommending an MRI.  No acute anemia, no significant electrolyte abnormalities.  MRI concerning for stroke.  CTA without LVO. Neuro not recommending TNK.  Will discuss with medicine for admission.   The patients results and plan were reviewed and discussed.   Any x-rays performed were independently reviewed by myself.   Differential diagnosis were considered with the presenting HPI.  Medications  sodium chloride  flush (NS) 0.9 % injection 3 mL (0 mLs Intravenous Hold 09/27/23 0301)  LORazepam  (ATIVAN ) injection 2 mg (2 mg Intramuscular Given 09/27/23 0249)  iohexol  (OMNIPAQUE ) 350 MG/ML injection 100 mL (100 mLs Intravenous Contrast Given 09/27/23 0443)    Vitals:   09/27/23 0217 09/27/23 0325 09/27/23 0345  BP: (!) 188/87 ROLLEN)  158/78 (!) 158/78  Pulse: (!) 58 61 (!) 58  Resp: 17  12  Temp: 98.4 F (36.9 C)    SpO2: 100% 100% 100%    Final diagnoses:  Acute ischemic stroke [facility])    Admission/ observation were discussed with the admitting physician, patient and/or family and they are comfortable with the plan.          Final Clinical Impression(s) / ED Diagnoses Final diagnoses:  Acute ischemic stroke [facility])    Rx / DC Orders ED Discharge Orders     None         Emil Share, DO 09/27/23 [id number]

## 2023-09-27 NOTE — Assessment & Plan Note (Addendum)
 Fasting lipid panel: LDL 77  Increase lipitor to 40mg  daily

## 2023-09-27 NOTE — Progress Notes (Signed)
 BLE venous duplex has been completed.   Results can be found under chart review under CV PROC. 09/27/2023 11:31 AM Kaye Mitro RVT, RDMS

## 2023-09-27 NOTE — H&P (Signed)
 History and Physical    Patient: [name] FMW:[id number] DOB: June 11, 1946 DOA: 09/27/2023 DOS: the patient was seen and examined on 09/27/2023 PCP: Charlott Dorn LABOR, MD  Patient coming from: Home - lives with his wife. Ambulates independently.    Chief Complaint: left sided numbness   HPI: [name] is a 78 y.o. male with medical history significant of hx of CVA, GERD, HTN, HLD, OSA on cpap, CKD stage 3a, BPH, GERD who presented to Ed with complaints of left sided numbness. Around 1am he got up to go to the bathroom. He noticed his left arm felt heavy. He sat down on the toilet and when he tried to get up he couldn't pull up his PJ bottoms. He then had tingling in his left arm and tingling in the left side of his face/back of head that turned into numbness. He had this with his previous stroke over 20 years ago. He has maybe some mild weakness in his left lower leg that he may have noticed skiing, but overall he does well. He noticed no weakness in his lower leg. He had no drooping or slurred speech. His wife brought him to ED within an hour of symptoms. His strength is back in his LUE, still has decreased sensation over left side of his face and back of scalp.   He is on ASA 325mg  daily. Family history of stroke in his mother.   He has been feeling good. Denies any fever/chills, vision changes/headaches, chest pain or palpitations, shortness of breath or cough, abdominal pain, N/V/D, dysuria or leg swelling.  He does complain of leg cramps in the morning. No claudication.   He does not smoke or drink alcohol.   ER Course:  vitals: afebrile, bp: 188/87, HR: 58, RR: 17, oxygen: 100%RA Pertinent labs: creatinine: 1.5,  CT head: no acute finding MRI brain: 1. Subtle foci of diffusion signal abnormality involving the posterior right frontoparietal region, pre and postcentral gyri, consistent with small acute ischemic nonhemorrhagic infarcts. 2. Underlying mild chronic  microvascular ischemic disease with a few remote lacunar infarcts about the right basal ganglia. CTA head/neck: no LVO.  Head and neck Atherosclerosis most pronounced at:- Left ICA origin/bulb with bulky soft plaque, but no significant stenosis. - codominant Right Vertebral Artery V4 segment, with moderate to Severe stenosis.3. Generalized arterial tortuosity. In ED: code stroke called, neurology following. TR[facility] asked to admit.    Review of Systems: As mentioned in the history of present illness. All other systems reviewed and are negative. Past Medical History:  Diagnosis Date   Anal fissure    Arthritis    hands,knees, ankles   CKD (chronic kidney disease)    CVA (cerebral infarction)    GERD (gastroesophageal reflux disease)    Hemorrhoids    Hyperlipidemia    Hypertension    Lesion of vocal cord    Pre-diabetes    diet controlled   PTSD (post-traumatic stress disorder)    Sinus drainage    post nasal drip from sinuses occasional.   Sleep apnea    uses CPAP   Stroke (HCC) 1997   slight weakness left side-not a problem.   Past Surgical History:  Procedure Laterality Date   ANAL FISSURE REPAIR  05/11/2011   COLONOSCOPY WITH PROPOFOL  N/A 02/06/2016   Procedure: COLONOSCOPY WITH PROPOFOL ;  Surgeon: Gladis MARLA Louder, MD;  Location: [facility];  Service: Endoscopy;  Laterality: N/A;   HEMORRHOID SURGERY     HERNIA REPAIR  Laryngoscopy Flexible Diagnostic  05/21/2023   in office w/dr bates with numbing medicine, no anesthesia   MICROLARYNGOSCOPY WITH CO2 LASER AND EXCISION OF VOCAL CORD LESION Bilateral 07/20/2022   Procedure: MICROLARYNGOSCOPY WITH CO2 LASER AND EXCISION OF VOCAL CORD LESION;  Surgeon: Carlie Clark, MD;  Location: [facility];  Service: ENT;  Laterality: Bilateral;   MICROLARYNGOSCOPY WITH CO2 LASER AND EXCISION OF VOCAL CORD LESION N/A 08/29/2023   Procedure: SUSPENSION MICRO DIRECT LARYNGOSCOPY WITH  biopsy;  Surgeon: Carlie Clark, MD;   Location: [facility];  Service: ENT;  Laterality: N/A;   ROTATOR CUFF REPAIR  08/20/2008   left   Social History:  reports that he quit smoking about 43 years ago. His smoking use included cigarettes. He has never used smokeless tobacco. He reports current drug use. Drug: Marijuana. He reports that he does not drink alcohol.  Allergies  Allergen Reactions   Vistaril [Hydroxyzine] Other (See Comments)    Unknown reaction   Zoloft [Sertraline] Other (See Comments)    Erectile dysfunction    Family History  Problem Relation Age of Onset   Heart disease Mother        Died of MI at age 34   Cancer Father        stomach   Cancer Paternal Uncle        prostate    Prior to Admission medications   Medication Sig Start Date End Date Taking? Authorizing Provider  aspirin  325 MG tablet Take 325 mg by mouth daily.      [provider]  atorvastatin  (LIPITOR) 20 MG tablet Take 20 mg by mouth daily.    [provider]  azelastine (ASTELIN) 0.1 % nasal spray Place 137 mcg into the nose daily as needed for allergies or rhinitis.    [provider]  Cholecalciferol (VITAMIN D) 50 MCG (2000 UT) tablet Take 2,000 Units by mouth daily.    [provider]  COVID-19 mRNA Vac-TriS, Pfizer, (PFIZER-BIONT COVID-19 VAC-TRIS) SUSP injection Inject into the muscle. 12/08/20   Luiz Channel, MD  fluticasone (FLONASE) 50 MCG/ACT nasal spray Place 2 sprays into both nostrils daily as needed for allergies. 07/14/19   [provider]  GARLIC PO Take 1 tablet by mouth daily.    [provider]  metoprolol  succinate (TOPROL -XL) 50 MG 24 hr tablet Take 50 mg by mouth daily. Take with or immediately following a meal.    [provider]  montelukast (SINGULAIR) 10 MG tablet Take 10 mg by mouth daily as needed (allergies). 03/19/23   [provider]  omeprazole (PRILOSEC) 20 MG capsule Take 20 mg by mouth daily as needed (reflux).    [provider]  tamsulosin  (FLOMAX ) 0.4 MG CAPS capsule Take 0.4 mg by mouth at bedtime.    [provider]  valsartan-hydrochlorothiazide (DIOVAN-HCT) 160-25 MG tablet Take 0.5 tablets by mouth daily.    [provider]    Physical Exam: Vitals:   09/27/23 0701 09/27/23 0900 09/27/23 0930 09/27/23 1000  BP: 123/71 110/60 122/74 110/81  Pulse: 62 (!) 59 (!) 57 (!) 54  Resp: (!) 23 16 16  (!) 21  Temp:      TempSrc:      SpO2: 100% 99% 98% 96%   General:  Appears calm and comfortable and is in NAD Eyes:  PERRL, EOMI, normal lids, iris ENT:  grossly normal hearing, lips & tongue, mmm; appropriate dentition Neck:  no LAD, masses or thyromegaly; no carotid bruits  Cardiovascular:  RRR, no m/r/g. No LE edema.  Respiratory:   CTA bilaterally with no wheezes/rales/rhonchi.  Normal respiratory effort. Abdomen:  soft, NT, ND, NABS Back:   normal alignment, no CVAT Skin:  no rash or induration seen on limited exam Musculoskeletal:  grossly normal tone BUE/BLE, good ROM, no bony abnormality. LLE: 4/5, RLE:5/5 (baseline). Bilateral UE: 5/5. Hand grip: 5/5  Lower extremity:  No LE edema.  Limited foot exam with no ulcerations.  2+ distal pulses. Psychiatric:  grossly normal mood and affect, speech fluent and appropriate, AOx3 Neurologic:  CN 2-12 grossly intact, moves all extremities in coordinated fashion, sensation intact, but decreased over left side of face. HTK intact bilaterally. FTN intact bilaterally. Negative pronator drift. Peripheral vision intact. Gait deferred.    Radiological Exams on Admission: Independently reviewed - see discussion in A/P where applicable  CT ANGIO HEAD NECK W WO CM W PERF (CODE STROKE) Result Date: 09/27/2023 CLINICAL DATA:  78 year old male code stroke presentation. Subtle diffusion abnormality in the posterior right MCA territory on MRI this morning. EXAM: CT ANGIOGRAPHY HEAD AND NECK CT PERFUSION BRAIN TECHNIQUE: Multidetector CT imaging of the head and  neck was performed using the standard protocol during bolus administration of intravenous contrast. Multiplanar CT image reconstructions and MIPs were obtained to evaluate the vascular anatomy. Carotid stenosis measurements (when applicable) are obtained utilizing NASCET criteria, using the distal internal carotid diameter as the denominator. Multiphase CT imaging of the brain was performed following IV bolus contrast injection. Subsequent parametric perfusion maps were calculated using RAPID software. RADIATION DOSE REDUCTION: This exam was performed according to the departmental dose-optimization program which includes automated exposure control, adjustment of the mA and/or kV according to patient size and/or use of iterative reconstruction technique. CONTRAST:  OMNIPAQUE  IOHEXOL  350 MG/ML SOLN COMPARISON:  Brain MRI 0258 hours.  Head CT 0228 hours. FINDINGS: CT Brain Perfusion Findings: ASPECTS: 10 at 0228 hours. CBF (<30%) Volume: 0mL Perfusion (Tmax>6.0s) volume: 0mL Mismatch Volume: Not applicable Infarction Location:Not applicable No perfusion parameter abnormality is detected. CTA NECK Skeleton: No acute osseous abnormality identified. Ordinary cervical spine degeneration. Upper chest: Mild atelectasis. Other neck: Subcentimeter thyroid  nodules Not clinically significant; no follow-up imaging recommended (ref: J Am Coll Radiol. 2015 Feb;12(2): 143-50). Other neck soft tissue spaces are within normal limits. Aortic arch: 3 vessel arch. Mild arch tortuosity and calcified atherosclerosis. Generally tortuous proximal great vessels. Right carotid system: Tortuous brachiocephalic artery and proximal right CCA with no plaque or stenosis. Mild calcified more so than soft plaque at the right carotid bifurcation with no significant stenosis. Left carotid system: Mildly tortuous left CCA with no plaque or stenosis. Moderate soft more so than calcified plaque at the posterior left ICA origin (series 6, image 103)  and continues into the left ICA bulb (series 11, image 141). However, stenosis is less than 50 % with respect to the distal vessel. Distal to the bulb left ICA is negative to the skull base. Vertebral arteries: Tortuous right subclavian artery without plaque or stenosis. Mild calcified plaque adjacent to the right vertebral artery origin without stenosis on series 7, image 348. Right vertebral artery then patent and normal to the skull base. Tortuous proximal left subclavian artery and left vertebral artery origin without plaque or stenosis. Codominant left vertebral artery is patent and within normal limits to the skull base. CTA HEAD Posterior circulation: Fairly codominant distal vertebral arteries with bulky right V4 segment soft and calcified plaque resulting in moderate to severe stenosis on  series 10, image 147. slightly diminished caliber of the vessel distal to that segment. Both PICA origins remain normal. Left V4 segment is patent with minimal plaque. Patent vertebrobasilar junction without stenosis. Patent basilar artery with mild irregularity, mild plaque but no significant stenosis. Patent SCA and PCA origins. Posterior communicating arteries are diminutive or absent. Bilateral PCA branches are within normal limits. Anterior circulation: Both ICA siphons are patent. Left siphon calcified plaque maximal at the distal cavernous segment with only mild stenosis. Right siphon mild calcified plaque with mild stenosis. Patent carotid termini. Normal MCA and ACA origins. Mild M1 and A1 tortuosity. Diminutive or absent anterior communicating artery. Bilateral ACA branches are within normal limits. Left MCA M1 segment and bifurcation are patent without stenosis. Left MCA branches are within normal limits. Right MCA M1 segment and trifurcation are tortuous, patent without stenosis. Right MCA branches are within normal limits. No convincing distal branch occlusion. Venous sinuses: Patent. Anatomic variants: None.  Review of the MIP images confirms the above findings IMPRESSION: 1. Negative CT Perfusion. CTA is negative for large vessel occlusion, with no convincing Right MCA branch occlusion. 2. Head and neck Atherosclerosis most pronounced at: - Left ICA origin/bulb with bulky soft plaque, but no significant stenosis. - codominant Right Vertebral Artery V4 segment, with moderate to Severe stenosis. 3. Generalized arterial tortuosity. Electronically Signed   By: VEAR Hurst M.D.   On: 09/27/2023 05:10   MR BRAIN WO CONTRAST Result Date: 09/27/2023 CLINICAL DATA:  Initial evaluation for acute neuro deficit, stroke suspected. EXAM: MRI HEAD WITHOUT CONTRAST TECHNIQUE: Multiplanar, multiecho pulse sequences of the brain and surrounding structures were obtained without intravenous contrast. COMPARISON:  CT from earlier the same day. FINDINGS: Brain: Cerebral volume within normal limits. Scattered patchy T2/FLAIR hyperintensity involving the periventricular deep white matter both cerebral hemispheres, consistent with chronic small vessel ischemic disease, mild for age. Few remote lacunar infarcts present about the right basal ganglia. Subtle serpiginous focus of diffusion signal abnormality seen involving the cortical gray matter of the high posterior right parietal lobe, postcentral gyrus (series 5, image 7), suspicious for a small acute ischemic infarct. Probable additional subtle patchy involvement of the precentral gyrus as well (same image). Suspected tiny subcortical involvement noted inferiorly (series 5, image 12). These changes are not well seen on corresponding coronal sequence. No associated hemorrhage or mass effect. No other evidence for acute or subacute ischemia. Gray-white matter differentiation otherwise maintained. No acute intracranial hemorrhage. Single punctate chronic microhemorrhage noted at the posterior left cerebral hemisphere, likely small vessel related. No mass lesion, midline shift or mass effect. No  hydrocephalus or extra-axial fluid collection. Pituitary gland and suprasellar region within normal limits. Vascular: Major intracranial vascular flow voids are maintained. Skull and upper cervical spine: Craniocervical junction within normal limits. Bone marrow signal intensity normal. No scalp soft tissue abnormality. Sinuses/Orbits: Globes and orbital soft tissues within normal limits. Paranasal sinuses are largely clear. No significant mastoid effusion. Other: None. IMPRESSION: 1. Subtle foci of diffusion signal abnormality involving the posterior right frontoparietal region, pre and postcentral gyri, consistent with small acute ischemic nonhemorrhagic infarcts. 2. Underlying mild chronic microvascular ischemic disease with a few remote lacunar infarcts about the right basal ganglia. Electronically Signed   By: Morene Hoard M.D.   On: 09/27/2023 03:46   CT HEAD CODE STROKE WO CONTRAST Result Date: 09/27/2023 CLINICAL DATA:  Code stroke. Initial evaluation for acute stroke, left-sided numbness. EXAM: CT HEAD WITHOUT CONTRAST TECHNIQUE: Contiguous axial images were obtained from the  base of the skull through the vertex without intravenous contrast. RADIATION DOSE REDUCTION: This exam was performed according to the departmental dose-optimization program which includes automated exposure control, adjustment of the mA and/or kV according to patient size and/or use of iterative reconstruction technique. COMPARISON:  None Available. FINDINGS: Brain: Cerebral volume within normal limits. No acute intracranial hemorrhage. No acute large vessel territory infarct. No mass lesion or midline shift. No extra-axial fluid collection. Vascular: No convincing hyperdense vessel. Scattered vascular calcifications noted within the carotid siphons. Skull: Scalp soft tissues within normal limits for age calvarium intact. Sinuses/Orbits: Globes and orbital soft tissues within normal limits. Paranasal sinuses are clear. No  mastoid effusion. Other: None. ASPECTS [facility] Stroke Program Early CT Score) - Ganglionic level infarction (caudate, lentiform nuclei, internal capsule, insula, M1-M3 cortex): 7 - Supraganglionic infarction (M4-M6 cortex): 3 Total score (0-10 with 10 being normal): 10 IMPRESSION: 1. Negative head CT.  No acute intracranial abnormality. 2. ASPECTS is 10. These results were communicated to Dr. Arora at 2:38 am on 09/27/2023 by text page via the [facility] messaging system. Electronically Signed   By: Morene Hoard M.D.   On: 09/27/2023 02:38    EKG: Independently reviewed.  NSR with rate 56; nonspecific ST changes with no evidence of acute ischemia   Labs on Admission: I have personally reviewed the available labs and imaging studies at the time of the admission.  Pertinent labs:   Creatinine 1.5   Assessment and Plan: Principal Problem:   Acute CVA (cerebrovascular accident) (HCC) Active Problems:   Essential hypertension   Hyperlipidemia   Chronic kidney disease, stage 3a (HCC)   GERD (gastroesophageal reflux disease)   OSA on CPAP   Benign prostatic hyperplasia with lower urinary tract symptoms   Leg cramping    Assessment and Plan: * Acute CVA (cerebrovascular accident) (HCC) 78 year old male who presented to ED with left facial numbness and UE weakness found to have a subtle foci of diffusion signal abnormality involving the posterior right frontoparietal region, pre and postcentral gyri, consistent with small acute ischemic nonhemorrhagic infarcts by MRI.  -admit to progressive for acute CVA on tele  -Neurochecks per protocol -Neurology consulted -echo and dopplers pending -CTA head/neck with no LVO  -fasting LDL 77, increase lipitor to 40mg  daily  -A1C of 6.5  -on ASA 325mg  daily, anti platelets per neurology  -Permissive hypertension first 24 hours <220/110 -consider LT cardiac monitor  -passed bedside swallow screen  -PT/ OT/ SLP consult -left LLE weakness at baseline  from previous CVA    Essential hypertension Allow for permissive HTN of 220/120 for first 24 hours  Home medication includes diovan-hct and toprol -xl, hold for now  PRN IV medication for parameters above   Hyperlipidemia Fasting lipid panel: LDL 77  Increase lipitor to 40mg  daily   Chronic kidney disease, stage 3a (HCC) Baseline creatinine appears to be around 1.5 Stable, continue to monitor   GERD (gastroesophageal reflux disease) Continue PPI PRN   OSA on CPAP Cpap nightly   Benign prostatic hyperplasia with lower urinary tract symptoms Continue flomax    Leg cramping Check CK, TSH and B12 Increase water , stretch at night     Advance Care Planning:   Code Status: Full Code    Consults: neurology: Dr.Arora   DVT Prophylaxis: lovenox    Family Communication: wife at bedside   Severity of Illness: The appropriate patient status for this patient is INPATIENT. Inpatient status is judged to be reasonable and necessary in order to  provide the required intensity of service to ensure the patient's safety. The patient's presenting symptoms, physical exam findings, and initial radiographic and laboratory data in the context of their chronic comorbidities is felt to place them at high risk for further clinical deterioration. Furthermore, it is not anticipated that the patient will be medically stable for discharge from the hospital within 2 midnights of admission.   * I certify that at the point of admission it is my clinical judgment that the patient will require inpatient hospital care spanning beyond 2 midnights from the point of admission due to high intensity of service, high risk for further deterioration and high frequency of surveillance required.*  Author: Isaiah Geralds, MD 09/27/2023 10:36 AM  For on call review [email or web address].

## 2023-09-27 NOTE — Assessment & Plan Note (Signed)
Continue PPI PRN

## 2023-09-27 NOTE — Progress Notes (Signed)
  Echocardiogram 2D Echocardiogram has been performed.  Fain Home RDCS 09/27/2023, 2:01 PM

## 2023-09-27 NOTE — ED Notes (Signed)
Patient transported to Vascular Ultrasound °

## 2023-09-27 NOTE — ED Triage Notes (Addendum)
 Patient reports sudden onset left arm and left face numbness/tingling at 1 am this morning . Left arm drift at triage . History of stroke . CBG=109.

## 2023-09-27 NOTE — ED Notes (Signed)
 CCMD called.

## 2023-09-27 NOTE — Assessment & Plan Note (Signed)
 Check CK, TSH and B12 Increase water , stretch at night

## 2023-09-27 NOTE — Assessment & Plan Note (Signed)
 Continue flomax

## 2023-09-27 NOTE — Assessment & Plan Note (Addendum)
 78 year old male who presented to ED with left facial numbness and UE weakness found to have a subtle foci of diffusion signal abnormality involving the posterior right frontoparietal region, pre and postcentral gyri, consistent with small acute ischemic nonhemorrhagic infarcts by MRI.  -admit to progressive for acute CVA on tele  -Neurochecks per protocol -Neurology consulted -echo and dopplers pending -CTA head/neck with no LVO  -fasting LDL 77, increase lipitor to 40mg  daily  -A1C of 6.5  -on ASA 325mg  daily, anti platelets per neurology  -Permissive hypertension first 24 hours <220/110 -consider LT cardiac monitor  -passed bedside swallow screen  -PT/ OT/ SLP consult -left LLE weakness at baseline from previous CVA

## 2023-09-27 NOTE — ED Notes (Signed)
 Pt transported to CT ?

## 2023-09-27 NOTE — Progress Notes (Addendum)
 STROKE TEAM PROGRESS NOTE   INTERIM HISTORY/SUBJECTIVE Seen in room with wife at the bedside.  30 day monitor with outpatient follow-up for loop recorder.  Discussed with both EP and Cards Master.  Neuroexam is stable, still reporting a little numbness in his left arm.  2D echocardiogram pending, DVT study negative.  Hemodynamically stable  OBJECTIVE  CBC    Component Value Date/Time   WBC 6.7 09/27/2023 0227   RBC 4.57 09/27/2023 0227   HGB 12.3 (L) 09/27/2023 0227   HCT 38.5 (L) 09/27/2023 0227   PLT 172 09/27/2023 0227   MCV 84.2 09/27/2023 0227   MCH 26.9 09/27/2023 0227   MCHC 31.9 09/27/2023 0227   RDW 13.4 09/27/2023 0227   LYMPHSABS 2.8 09/27/2023 0227   MONOABS 0.6 09/27/2023 0227   EOSABS 0.3 09/27/2023 0227   BASOSABS 0.0 09/27/2023 0227    BMET    Component Value Date/Time   NA 138 09/27/2023 0227   K 3.8 09/27/2023 0227   CL 104 09/27/2023 0227   CO2 24 09/27/2023 0227   GLUCOSE 117 (H) 09/27/2023 0227   BUN 17 09/27/2023 0227   CREATININE 1.54 (H) 09/27/2023 0227   CALCIUM  9.1 09/27/2023 0227   GFRNONAA 46 (L) 09/27/2023 0227    IMAGING past 24 hours VAS US  LOWER EXTREMITY VENOUS (DVT) Result Date: 09/27/2023  Lower Venous DVT Study Patient Name:  [name] [facility]  Date of Exam:   09/27/2023 Medical Rec #: [id number]         Accession #:    [id number] Date of Birth: [birth year]         Patient Gender: M Patient Age:   15 years Exam Location:  [facility] Procedure:      VAS US  LOWER EXTREMITY VENOUS (DVT) Referring Phys: ARY Brynli Ollis --------------------------------------------------------------------------------  Indications: Stroke.  Comparison Study: No previous exams Performing Technologist: Jody Hill RVT, RDMS  Examination Guidelines: A complete evaluation includes B-mode imaging, spectral Doppler, color Doppler, and power Doppler as needed of all accessible portions of each vessel. Bilateral testing is considered an integral part of a complete  examination. Limited examinations for reoccurring indications may be performed as noted. The reflux portion of the exam is performed with the patient in reverse Trendelenburg.  +---------+---------------+---------+-----------+----------+-------------------+ RIGHT    CompressibilityPhasicitySpontaneityPropertiesThrombus Aging      +---------+---------------+---------+-----------+----------+-------------------+ CFV      Full           Yes      Yes                                      +---------+---------------+---------+-----------+----------+-------------------+ SFJ      Full                                                             +---------+---------------+---------+-----------+----------+-------------------+ FV Prox  Full           Yes      Yes                                      +---------+---------------+---------+-----------+----------+-------------------+ FV Mid   Full  Yes      Yes                                      +---------+---------------+---------+-----------+----------+-------------------+ FV DistalFull           Yes      Yes                                      +---------+---------------+---------+-----------+----------+-------------------+ PFV      Full                                                             +---------+---------------+---------+-----------+----------+-------------------+ POP      Full           Yes      Yes                                      +---------+---------------+---------+-----------+----------+-------------------+ PTV      Full                                                             +---------+---------------+---------+-----------+----------+-------------------+ PERO     Full                                         Not well visualized +---------+---------------+---------+-----------+----------+-------------------+    +---------+---------------+---------+-----------+----------+-------------------+ LEFT     CompressibilityPhasicitySpontaneityPropertiesThrombus Aging      +---------+---------------+---------+-----------+----------+-------------------+ CFV      Full           Yes      Yes                                      +---------+---------------+---------+-----------+----------+-------------------+ SFJ      Full                                                             +---------+---------------+---------+-----------+----------+-------------------+ FV Prox  Full           Yes      Yes                                      +---------+---------------+---------+-----------+----------+-------------------+ FV Mid   Full           Yes      Yes                                      +---------+---------------+---------+-----------+----------+-------------------+  FV DistalFull           Yes      Yes                                      +---------+---------------+---------+-----------+----------+-------------------+ PFV      Full                                                             +---------+---------------+---------+-----------+----------+-------------------+ POP      Full           Yes      Yes                                      +---------+---------------+---------+-----------+----------+-------------------+ PTV                                                   Not well visualized +---------+---------------+---------+-----------+----------+-------------------+ PERO     Full                                                             +---------+---------------+---------+-----------+----------+-------------------+     Summary: BILATERAL: - No evidence of deep vein thrombosis seen in the lower extremities, bilaterally. -No evidence of popliteal cyst, bilaterally.   *See table(s) above for measurements and observations.    Preliminary    CT ANGIO HEAD NECK W  WO CM W PERF (CODE STROKE) Result Date: 09/27/2023 CLINICAL DATA:  78 year old male code stroke presentation. Subtle diffusion abnormality in the posterior right MCA territory on MRI this morning. EXAM: CT ANGIOGRAPHY HEAD AND NECK CT PERFUSION BRAIN TECHNIQUE: Multidetector CT imaging of the head and neck was performed using the standard protocol during bolus administration of intravenous contrast. Multiplanar CT image reconstructions and MIPs were obtained to evaluate the vascular anatomy. Carotid stenosis measurements (when applicable) are obtained utilizing NASCET criteria, using the distal internal carotid diameter as the denominator. Multiphase CT imaging of the brain was performed following IV bolus contrast injection. Subsequent parametric perfusion maps were calculated using RAPID software. RADIATION DOSE REDUCTION: This exam was performed according to the departmental dose-optimization program which includes automated exposure control, adjustment of the mA and/or kV according to patient size and/or use of iterative reconstruction technique. CONTRAST:  OMNIPAQUE  IOHEXOL  350 MG/ML SOLN COMPARISON:  Brain MRI 0258 hours.  Head CT 0228 hours. FINDINGS: CT Brain Perfusion Findings: ASPECTS: 10 at 0228 hours. CBF (<30%) Volume: 0mL Perfusion (Tmax>6.0s) volume: 0mL Mismatch Volume: Not applicable Infarction Location:Not applicable No perfusion parameter abnormality is detected. CTA NECK Skeleton: No acute osseous abnormality identified. Ordinary cervical spine degeneration. Upper chest: Mild atelectasis. Other neck: Subcentimeter thyroid  nodules Not clinically significant; no follow-up imaging recommended (ref: J Am Coll Radiol. 2015 Feb;12(2): 143-50). Other neck soft tissue spaces are within normal limits. Aortic  arch: 3 vessel arch. Mild arch tortuosity and calcified atherosclerosis. Generally tortuous proximal great vessels. Right carotid system: Tortuous brachiocephalic artery and proximal right CCA  with no plaque or stenosis. Mild calcified more so than soft plaque at the right carotid bifurcation with no significant stenosis. Left carotid system: Mildly tortuous left CCA with no plaque or stenosis. Moderate soft more so than calcified plaque at the posterior left ICA origin (series 6, image 103) and continues into the left ICA bulb (series 11, image 141). However, stenosis is less than 50 % with respect to the distal vessel. Distal to the bulb left ICA is negative to the skull base. Vertebral arteries: Tortuous right subclavian artery without plaque or stenosis. Mild calcified plaque adjacent to the right vertebral artery origin without stenosis on series 7, image 348. Right vertebral artery then patent and normal to the skull base. Tortuous proximal left subclavian artery and left vertebral artery origin without plaque or stenosis. Codominant left vertebral artery is patent and within normal limits to the skull base. CTA HEAD Posterior circulation: Fairly codominant distal vertebral arteries with bulky right V4 segment soft and calcified plaque resulting in moderate to severe stenosis on series 10, image 147. slightly diminished caliber of the vessel distal to that segment. Both PICA origins remain normal. Left V4 segment is patent with minimal plaque. Patent vertebrobasilar junction without stenosis. Patent basilar artery with mild irregularity, mild plaque but no significant stenosis. Patent SCA and PCA origins. Posterior communicating arteries are diminutive or absent. Bilateral PCA branches are within normal limits. Anterior circulation: Both ICA siphons are patent. Left siphon calcified plaque maximal at the distal cavernous segment with only mild stenosis. Right siphon mild calcified plaque with mild stenosis. Patent carotid termini. Normal MCA and ACA origins. Mild M1 and A1 tortuosity. Diminutive or absent anterior communicating artery. Bilateral ACA branches are within normal limits. Left MCA M1  segment and bifurcation are patent without stenosis. Left MCA branches are within normal limits. Right MCA M1 segment and trifurcation are tortuous, patent without stenosis. Right MCA branches are within normal limits. No convincing distal branch occlusion. Venous sinuses: Patent. Anatomic variants: None. Review of the MIP images confirms the above findings IMPRESSION: 1. Negative CT Perfusion. CTA is negative for large vessel occlusion, with no convincing Right MCA branch occlusion. 2. Head and neck Atherosclerosis most pronounced at: - Left ICA origin/bulb with bulky soft plaque, but no significant stenosis. - codominant Right Vertebral Artery V4 segment, with moderate to Severe stenosis. 3. Generalized arterial tortuosity. Electronically Signed   By: VEAR Hurst M.D.   On: 09/27/2023 05:10   MR BRAIN WO CONTRAST Result Date: 09/27/2023 CLINICAL DATA:  Initial evaluation for acute neuro deficit, stroke suspected. EXAM: MRI HEAD WITHOUT CONTRAST TECHNIQUE: Multiplanar, multiecho pulse sequences of the brain and surrounding structures were obtained without intravenous contrast. COMPARISON:  CT from earlier the same day. FINDINGS: Brain: Cerebral volume within normal limits. Scattered patchy T2/FLAIR hyperintensity involving the periventricular deep white matter both cerebral hemispheres, consistent with chronic small vessel ischemic disease, mild for age. Few remote lacunar infarcts present about the right basal ganglia. Subtle serpiginous focus of diffusion signal abnormality seen involving the cortical gray matter of the high posterior right parietal lobe, postcentral gyrus (series 5, image 7), suspicious for a small acute ischemic infarct. Probable additional subtle patchy involvement of the precentral gyrus as well (same image). Suspected tiny subcortical involvement noted inferiorly (series 5, image 12). These changes are not well seen on corresponding coronal  sequence. No associated hemorrhage or mass effect. No  other evidence for acute or subacute ischemia. Gray-white matter differentiation otherwise maintained. No acute intracranial hemorrhage. Single punctate chronic microhemorrhage noted at the posterior left cerebral hemisphere, likely small vessel related. No mass lesion, midline shift or mass effect. No hydrocephalus or extra-axial fluid collection. Pituitary gland and suprasellar region within normal limits. Vascular: Major intracranial vascular flow voids are maintained. Skull and upper cervical spine: Craniocervical junction within normal limits. Bone marrow signal intensity normal. No scalp soft tissue abnormality. Sinuses/Orbits: Globes and orbital soft tissues within normal limits. Paranasal sinuses are largely clear. No significant mastoid effusion. Other: None. IMPRESSION: 1. Subtle foci of diffusion signal abnormality involving the posterior right frontoparietal region, pre and postcentral gyri, consistent with small acute ischemic nonhemorrhagic infarcts. 2. Underlying mild chronic microvascular ischemic disease with a few remote lacunar infarcts about the right basal ganglia. Electronically Signed   By: Morene Hoard M.D.   On: 09/27/2023 03:46   CT HEAD CODE STROKE WO CONTRAST Result Date: 09/27/2023 CLINICAL DATA:  Code stroke. Initial evaluation for acute stroke, left-sided numbness. EXAM: CT HEAD WITHOUT CONTRAST TECHNIQUE: Contiguous axial images were obtained from the base of the skull through the vertex without intravenous contrast. RADIATION DOSE REDUCTION: This exam was performed according to the departmental dose-optimization program which includes automated exposure control, adjustment of the mA and/or kV according to patient size and/or use of iterative reconstruction technique. COMPARISON:  None Available. FINDINGS: Brain: Cerebral volume within normal limits. No acute intracranial hemorrhage. No acute large vessel territory infarct. No mass lesion or midline shift. No extra-axial  fluid collection. Vascular: No convincing hyperdense vessel. Scattered vascular calcifications noted within the carotid siphons. Skull: Scalp soft tissues within normal limits for age calvarium intact. Sinuses/Orbits: Globes and orbital soft tissues within normal limits. Paranasal sinuses are clear. No mastoid effusion. Other: None. ASPECTS [facility] Stroke Program Early CT Score) - Ganglionic level infarction (caudate, lentiform nuclei, internal capsule, insula, M1-M3 cortex): 7 - Supraganglionic infarction (M4-M6 cortex): 3 Total score (0-10 with 10 being normal): 10 IMPRESSION: 1. Negative head CT.  No acute intracranial abnormality. 2. ASPECTS is 10. These results were communicated to Dr. Arora at 2:38 am on 09/27/2023 by text page via the [facility] messaging system. Electronically Signed   By: Morene Hoard M.D.   On: 09/27/2023 02:38    Vitals:   09/27/23 0930 09/27/23 1000 09/27/23 1145 09/27/23 1149  BP: 122/74 110/81 (!) 167/83   Pulse: (!) 57 (!) 54 76   Resp: 16 (!) 21 15   Temp:    98 F (36.7 C)  TempSrc:    Oral  SpO2: 98% 96% 99%      PHYSICAL EXAM General:  Alert, well-nourished, well-developed patient in no acute distress Psych:  Mood and affect appropriate for situation CV: Regular rate and rhythm on monitor Respiratory:  Regular, unlabored respirations on room air GI: Abdomen soft and nontender   NEURO:  Mental Status: AA&Ox3, patient is able to give clear and coherent history Speech/Language: speech is without dysarthria or aphasia.  Naming, repetition, fluency, and comprehension intact.  Cranial Nerves:  II: PERRL. Visual fields full.  III, IV, VI: EOMI. Eyelids elevate symmetrically.  V: Sensation is intact to light touch and symmetrical to face.  VII: Face is symmetrical resting and smiling VIII: hearing intact to voice. IX, X: Palate elevates symmetrically. Phonation is normal.  KP:Dynloizm shrug 5/5. XII: tongue is midline without fasciculations. Motor:  5/5 strength to all  muscle groups tested.  Tone: is normal and bulk is normal Sensation- Left arm numbness from forearm to hand.     Coordination: FTN intact bilaterally, HKS: no ataxia in BLE.No drift.  Gait- deferred  Most Recent NIH 1     ASSESSMENT/PLAN  Mr. [name] is a 78 y.o. male with history of [employer], with hx of prior stroke, hypertension, hyperlipidemia, PTSD, OSA uses CPAP, presented to the emergency department for evaluation of left-sided numbness. NIH 3.   Stroke:  small acute infarcts in the right frontoparietal cortex, pre and post central gyri  Etiology:  concern for cardioembolic etiology  Code Stroke CT head No acute abnormality. ASPECTS 10.    CTA head & neck CTA is negative for large vessel occlusion, left ICA origin/bulb with bulky soft plaque, but no significant stenosis.codominant Right Vertebral Artery V4 segment, with moderate to Severe stenosis. MRI  Subtle foci of diffusion signal abnormality involving the posterior right frontoparietal region, pre and postcentral gyri, consistent with small acute ischemic nonhemorrhagic infarcts. DVT study-negative 2D echo EF 55 to 60% Recommend 30-day CardioNet monitor as outpatient to rule out A-fib.  If negative, recommend loop recorder as outpatient. LDL 77  HgbA1c 6.5 UDS pending VTE prophylaxis -Lovenox  aspirin  325 mg daily prior to admission, now on aspirin  81 mg daily and clopidogrel  75 mg daily for 3 weeks and then Plavix  alone. Therapy recommendations:  Pending Disposition: Pending  Hx of Stroke/TIA Stroke in 1997- numbness left side, left side weakness but recovered - remote lacunar infarcts noted in the right basal ganglia on neuroimaging  Palpitations  Hypertension Seen Dr. Pietro in 2016 Home meds: metoprolol  succinate, Valsartan-hydrochlorothiazide 30-day monitor ordered Follow-up with EP outpatient for loop recorder.   Outpatient follow-up scheduled per  EP  Hyperlipidemia Home meds:  Atorvastatin  20 LDL 77, goal < 70 Increased Lipitor to 40 Continue statin at discharge  Other stroke risk factors Advanced age OSA on CPAP  Other acute issues PTSD Followed with [facility] Dr. 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 MD to update note as needed.   Jorene Last, DNP, FNP-BC [facility] Pager: [phone]  ATTENDING NOTE: I reviewed above note and agree with the assessment and plan. Pt was seen and examined.   Wife at bedside.  Patient reclining in bed for lunch.  He still complaining of left hand and forearm decreased sensation compared to the right.  Otherwise neuro intact.  He stated that he had a stroke 27 years ago with left-sided numbness and weakness but resolved after the stroke.  He did have left ptosis is following Dr. Margaret at [facility].  Current stroke embolic pattern, concerning for cardioembolic source.  He did complain of palpitation from time to time, will do 30-day CardioNet monitoring, if negative will need loop recorder as outpatient.  He follows Dr. Pietro in cardiology.  Currently on DAPT for 3 weeks and then Plavix  alone.  Increase Lipitor from 20-40.  PT and OT pending.  For detailed assessment and plan, please refer to above/below as I have made changes wherever appropriate.   Neurology will sign off. Please call with questions. Pt will follow up with Dr. Margaret at [facility] in about 4 weeks. Thanks for the consult.   Ary Cummins, MD PhD Stroke Neurology 09/27/2023 6:51 PM  I discussed with Dr. Waddell. I spent additional 30-minute  inpatient face-to-face time with the patient, more than 50% of which was spent in counseling and coordination of care, reviewing test results, images and medication, and discussing the diagnosis, treatment plan and potential prognosis. This patient's care  requiresreview of multiple databases, neurological assessment, discussion with family, other specialists and medical decision making of high complexity.      To contact Stroke Continuity provider, please refer to [email or web address]. After hours, contact General Neurology

## 2023-09-27 NOTE — Assessment & Plan Note (Signed)
Cpap nightly.

## 2023-09-27 NOTE — Assessment & Plan Note (Signed)
 Baseline creatinine appears to be around 1.5 Stable, continue to monitor

## 2023-09-27 NOTE — Consult Note (Signed)
 NEUROLOGY CONSULT NOTE   Date of service: September 27, 2023 Patient Name: SABRI TEAL MRN:  [id number] DOB:  August 08, 1946 Chief Complaint: code stroke - Left sided numbness Requesting Provider: Emil Share, DO  History of Present Illness  DELOIS TOLBERT is a 78 y.o. male Veteran of [employer], with hx of prior stroke with left-sided deficits that have completely resolved but who has since had left-sided sensory symptoms off and on for which she has been evaluated by neurology, hypertension, hyperlipidemia, PTSD, OSA uses CPAP, presented to the emergency department for evaluation of left-sided numbness. He reports that he went to bed at 11 PM on 09/26/2023 normal and when he woke up to go to the bathroom at 1 AM, he noticed that the left arm felt numb and almost scratchy in sensation.  He also started having a weird sensation from the back of his head on the left going down to his arm.  He says that he has had some symptoms of left-sided numbness off and on over the last few years but this was different than that.  He also reports having had a stroke that affected the left side, 25 years ago with no residual deficits. Denies chest pain shortness of breath nausea vomiting.  Reports head discomfort mostly in the back on the left occipital area.   LKW: 11 PM on 09/26/2023 Modified rankin score: 0-Completely asymptomatic and back to baseline post- stroke IV Thrombolysis: Low NIH stroke scale EVT: Low NIH, no ELVO  NIHSS components Score: Comment  1a Level of Conscious 0[x]  1[]  2[]  3[]      1b LOC Questions 0[x]  1[]  2[]       1c LOC Commands 0[x]  1[]  2[]       2 Best Gaze 0[x]  1[]  2[]       3 Visual 0[x]  1[]  2[]  3[]      4 Facial Palsy 0[x]  1[]  2[]  3[]      5a Motor Arm - left 0[]  1[x]  2[]  3[]  4[]  UN[]    5b Motor Arm - Right 0[x]  1[]  2[]  3[]  4[]  UN[]    6a Motor Leg - Left 0[]  1[x]  2[]  3[]  4[]  UN[]    6b Motor Leg - Right 0[x]  1[]  2[]  3[]  4[]  UN[]    7 Limb Ataxia 0[x]  1[]  2[]  3[]  UN[]      8 Sensory 0[]  1[x]  2[]  UN[]      9 Best Language 0[x]  1[]  2[]  3[]      10 Dysarthria 0[x]  1[]  2[]  UN[]      11 Extinct. and Inattention 0[x]  1[]  2[]       TOTAL: 3      ROS  Comprehensive ROS performed and pertinent positives documented in HPI   Past History   Past Medical History:  Diagnosis Date   Anal fissure    Arthritis    hands,knees, ankles   CKD (chronic kidney disease)    CVA (cerebral infarction)    GERD (gastroesophageal reflux disease)    Hemorrhoids    Hyperlipidemia    Hypertension    Lesion of vocal cord    Pre-diabetes    diet controlled   PTSD (post-traumatic stress disorder)    Sinus drainage    post nasal drip from sinuses occasional.   Sleep apnea    uses CPAP   Stroke (HCC) 1997   slight weakness left side-not a problem.    Past Surgical History:  Procedure Laterality Date   ANAL FISSURE REPAIR  05/11/2011   COLONOSCOPY WITH PROPOFOL  N/A 02/06/2016   Procedure: COLONOSCOPY WITH PROPOFOL ;  Surgeon: Gladis MARLA Louder, MD;  Location: [facility];  Service: Endoscopy;  Laterality: N/A;   HEMORRHOID SURGERY     HERNIA REPAIR     Laryngoscopy Flexible Diagnostic  05/21/2023   in office w/dr bates with numbing medicine, no anesthesia   MICROLARYNGOSCOPY WITH CO2 LASER AND EXCISION OF VOCAL CORD LESION Bilateral 07/20/2022   Procedure: MICROLARYNGOSCOPY WITH CO2 LASER AND EXCISION OF VOCAL CORD LESION;  Surgeon: Carlie Clark, MD;  Location: [facility];  Service: ENT;  Laterality: Bilateral;   MICROLARYNGOSCOPY WITH CO2 LASER AND EXCISION OF VOCAL CORD LESION N/A 08/29/2023   Procedure: SUSPENSION MICRO DIRECT LARYNGOSCOPY WITH  biopsy;  Surgeon: Carlie Clark, MD;  Location: [facility];  Service: ENT;  Laterality: N/A;   ROTATOR CUFF REPAIR  08/20/2008   left    Family History: Family History  Problem Relation Age of Onset   Heart disease Mother        Died of MI at age 65   Cancer Father        stomach   Cancer Paternal Uncle         prostate    Social History  reports that he quit smoking about 43 years ago. His smoking use included cigarettes. He has never used smokeless tobacco. He reports current drug use. Drug: Marijuana. He reports that he does not drink alcohol.  Allergies  Allergen Reactions   Hydroxyzine Other (See Comments)    Reaction: Unknown   Sertraline Other (See Comments)    Other Reaction(s): MALE ERECTILE DISORDER    Medications   Current Facility-Administered Medications:    LORazepam  (ATIVAN ) injection 2 mg, 2 mg, Intramuscular, Once, Lyncoln Maskell, MD   sodium chloride  flush (NS) 0.9 % injection 3 mL, 3 mL, Intravenous, Once, Griselda Norris, MD  Current Outpatient Medications:    aspirin  325 MG tablet, Take 325 mg by mouth daily.  , Disp: , Rfl:    atorvastatin  (LIPITOR) 20 MG tablet, Take 20 mg by mouth daily., Disp: , Rfl:    azelastine (ASTELIN) 0.1 % nasal spray, Place 137 mcg into the nose daily as needed for allergies or rhinitis., Disp: , Rfl:    Cholecalciferol (VITAMIN D) 50 MCG (2000 UT) tablet, Take 2,000 Units by mouth daily., Disp: , Rfl:    COVID-19 mRNA Vac-TriS, Pfizer, (PFIZER-BIONT COVID-19 VAC-TRIS) SUSP injection, Inject into the muscle., Disp: 0.3 mL, Rfl: 0   fluticasone (FLONASE) 50 MCG/ACT nasal spray, Place 2 sprays into both nostrils daily as needed for allergies., Disp: , Rfl:    GARLIC PO, Take 1 tablet by mouth daily., Disp: , Rfl:    metoprolol  succinate (TOPROL -XL) 50 MG 24 hr tablet, Take 50 mg by mouth daily. Take with or immediately following a meal., Disp: , Rfl:    montelukast (SINGULAIR) 10 MG tablet, Take 10 mg by mouth daily as needed (allergies)., Disp: , Rfl:    omeprazole (PRILOSEC) 20 MG capsule, Take 20 mg by mouth daily as needed (reflux)., Disp: , Rfl:    tamsulosin  (FLOMAX ) 0.4 MG CAPS capsule, Take 0.4 mg by mouth at bedtime., Disp: , Rfl:    valsartan-hydrochlorothiazide (DIOVAN-HCT) 160-25 MG tablet, Take 0.5 tablets by mouth daily., Disp: ,  Rfl:   Vitals   Vitals:   09/27/23 0217  BP: (!) 188/87  Pulse: (!) 58  Resp: 17  Temp: 98.4 F (36.9 C)  SpO2: 100%    There is no height or weight on file to calculate BMI.  Physical Exam  General: Well-developed well-nourished man in no acute distress HEENT: Normocephalic atraumatic Lungs: Clear Cardiovascular: Regular rate rhythm Abdomen nondistended nontender Neurological exam He is awake alert oriented x 3.  There is no dysarthria.  There is no aphasia. Cranial nerves II to XII-intact. Motor examination with mild drift in the left upper extremity and left lower extremity. Sensation: Described touch on the left arm feeling more scratchy and painful as compared to the right arm.  No other sensory difference Coordination examination reveals no dysmetria   Labs/Imaging/Neurodiagnostic studies   CBC:  Recent Labs  Lab 2023/10/04 0225 10-04-2023 0227  WBC  --  6.7  NEUTROABS  --  3.0  HGB 13.3 12.3*  HCT 39.0 38.5*  MCV  --  84.2  PLT  --  172   Basic Metabolic Panel:  Lab Results  Component Value Date   NA 140 10-04-2023   K 3.9 04-Oct-2023   CO2 24 08/29/2023   GLUCOSE 113 (H) 10/04/2023   BUN 19 2023-10-04   CREATININE 1.70 (H) 2023/10/04   CALCIUM  9.4 08/29/2023   GFRNONAA 48 (L) 08/29/2023   GFRAA >60 05/08/2011   CT Head without contrast(Personally reviewed): Aspects 10  CT angio Head and Neck with contrast(Personally reviewed): Done after the MRI.  No ELVO.  CT perfusion study with no perfusion deficits.  Stat MRI Brain(Personally reviewed): Question small area of restricted diffusion in the right postcentral gyrus-image 90/96 sequence 5.  Possible precentral gyrus DWI abnormality as well.  FLAIR with chronic microvascular changes.  ASSESSMENT   JOHNATHIN VANDERSCHAAF is a 78 y.o. male past medical history as above presenting for evaluation of sudden onset of left arm numbness and numbness from the back of the head going down to his left arm.  On  examination has a very mild left upper extremity and lower extremity drift along with left arm sensory changes which are more of allodynia rather than true numbness. Examination not very convincing for stroke hence taken for stat MRI which has a questionable area of restricted diffusion in the right postcentral gyrus. His symptoms are improving, now reports numbness more in the lower arm and not so much on the left shoulder.  With low NIH, discussed the risks benefits and alternatives of IV TNKase and he decided not to proceed with TNKase as the risks outweigh the benefits at this time. Would recommend admission for stroke risk factor workup irrespective. CTA with perfusion has been ordered just in case-so as to not miss any perfusion deficits or ELVO.  Took a while to get that because of IV access issues.  No ELVO or perfusion deficits noted.  Formal read pending at the time of this dictation.  Impression: Evaluate for right MCA territory acute ischemic stroke  RECOMMENDATIONS  Admit to hospitalist Frequent neurochecks Telemetry He is on aspirin  325 at home.  I would continue that for now. CT angio head and neck.  I would add the CT perfusion study since the stroke appears very cortical to ensure that there is no ELVO although clinically he does not look like any LVO. I would actually be interested to see if this finding of the abnormality on the DWI is truly a stroke and would recommend a repeat of the DWI sequences in the next 12 to 24 hours. 2D echo A1c Lipid panel Therapy assessments Permissive hypertension-treat only if systolic blood pressures greater than 220 on a as needed basis for the next 24 to 48 hours.  After that goal blood pressure  is normotension. Plan was discussed with the patient, and his wife at bedside. Plan was discussed with Dr. Emil EDP Stroke neurology will follow with you. ______________________________________________________________________   Signed, Eligio Lav, MD [facility]

## 2023-09-27 NOTE — Progress Notes (Signed)
   09/27/23 2115  BiPAP/CPAP/SIPAP  BiPAP/CPAP/SIPAP Pt Type Adult  Reason BIPAP/CPAP not in use Non-compliant  BiPAP/CPAP /SiPAP Vitals  Pulse Rate 64  Resp 18  SpO2 98 %  Bilateral Breath Sounds Clear  MEWS Score/Color  MEWS Score 0  MEWS Score Color [place]

## 2023-09-27 NOTE — Progress Notes (Signed)
 Ordered 30 day monitor for stroke  Dr. Arlester Ladd to read

## 2023-09-28 DIAGNOSIS — G4733 Obstructive sleep apnea (adult) (pediatric): Secondary | ICD-10-CM | POA: Diagnosis not present

## 2023-09-28 DIAGNOSIS — R252 Cramp and spasm: Secondary | ICD-10-CM | POA: Diagnosis not present

## 2023-09-28 DIAGNOSIS — K219 Gastro-esophageal reflux disease without esophagitis: Secondary | ICD-10-CM

## 2023-09-28 DIAGNOSIS — I10 Essential (primary) hypertension: Secondary | ICD-10-CM

## 2023-09-28 DIAGNOSIS — N1831 Chronic kidney disease, stage 3a: Secondary | ICD-10-CM

## 2023-09-28 DIAGNOSIS — I639 Cerebral infarction, unspecified: Secondary | ICD-10-CM | POA: Diagnosis not present

## 2023-09-28 LAB — BASIC METABOLIC PANEL
Anion gap: 9 (ref 5–15)
BUN: 15 mg/dL (ref 8–23)
CO2: 24 mmol/L (ref 22–32)
Calcium: 9 mg/dL (ref 8.9–10.3)
Chloride: 106 mmol/L (ref 98–111)
Creatinine, Ser: 1.42 mg/dL — ABNORMAL HIGH (ref 0.61–1.24)
GFR, Estimated: 51 mL/min — ABNORMAL LOW (ref >60)
Glucose, Bld: 110 mg/dL — ABNORMAL HIGH (ref 70–99)
Potassium: 3.8 mmol/L (ref 3.5–5.1)
Sodium: 139 mmol/L (ref 135–145)

## 2023-09-28 LAB — CBC WITH DIFFERENTIAL/PLATELET
Abs Immature Granulocytes: 0.01 10*3/uL (ref 0.00–0.07)
Basophils Absolute: 0 10*3/uL (ref 0.0–0.1)
Basophils Relative: 0 %
Eosinophils Absolute: 0.3 10*3/uL (ref 0.0–0.5)
Eosinophils Relative: 5 %
HCT: 36.5 % — ABNORMAL LOW (ref 39.0–52.0)
Hemoglobin: 11.8 g/dL — ABNORMAL LOW (ref 13.0–17.0)
Immature Granulocytes: 0 %
Lymphocytes Relative: 31 %
Lymphs Abs: 1.7 10*3/uL (ref 0.7–4.0)
MCH: 26.6 pg (ref 26.0–34.0)
MCHC: 32.3 g/dL (ref 30.0–36.0)
MCV: 82.2 fL (ref 80.0–100.0)
Monocytes Absolute: 0.5 10*3/uL (ref 0.1–1.0)
Monocytes Relative: 9 %
Neutro Abs: 3.1 10*3/uL (ref 1.7–7.7)
Neutrophils Relative %: 55 %
Platelets: 162 10*3/uL (ref 150–400)
RBC: 4.44 MIL/uL (ref 4.22–5.81)
RDW: 13.3 % (ref 11.5–15.5)
WBC: 5.5 10*3/uL (ref 4.0–10.5)
nRBC: 0 % (ref 0.0–0.2)

## 2023-09-28 LAB — HEPATIC FUNCTION PANEL
ALT: 24 U/L (ref 0–44)
AST: 21 U/L (ref 15–41)
Albumin: 3 g/dL — ABNORMAL LOW (ref 3.5–5.0)
Alkaline Phosphatase: 46 U/L (ref 38–126)
Bilirubin, Direct: <0.1 mg/dL (ref 0.0–0.2)
Total Bilirubin: 1.1 mg/dL (ref 0.0–1.2)
Total Protein: 6 g/dL — ABNORMAL LOW (ref 6.5–8.1)

## 2023-09-28 LAB — PHOSPHORUS: Phosphorus: 3.2 mg/dL (ref 2.5–4.6)

## 2023-09-28 LAB — MAGNESIUM: Magnesium: 1.7 mg/dL (ref 1.7–2.4)

## 2023-09-28 MED ORDER — CLOPIDOGREL BISULFATE 75 MG PO TABS: 75.0000 mg | ORAL_TABLET | Freq: Every day | ORAL | 1 refills | Status: DC

## 2023-09-28 MED ORDER — PANTOPRAZOLE SODIUM 40 MG PO TBEC: 40.0000 mg | DELAYED_RELEASE_TABLET | Freq: Every day | ORAL | 0 refills | Status: AC

## 2023-09-28 MED ORDER — ASPIRIN 81 MG PO TBEC: 81.0000 mg | DELAYED_RELEASE_TABLET | Freq: Every day | ORAL | 0 refills | Status: DC

## 2023-09-28 MED ORDER — ONDANSETRON HCL 4 MG PO TABS
4.0000 mg | ORAL_TABLET | Freq: Four times a day (QID) | ORAL | 0 refills | Status: AC | PRN
Start: 2023-09-28 — End: ?

## 2023-09-28 MED ORDER — ATORVASTATIN CALCIUM 40 MG PO TABS: 40.0000 mg | ORAL_TABLET | Freq: Every day | ORAL | 0 refills | Status: DC

## 2023-09-28 NOTE — Discharge Summary (Signed)
 Physician Discharge Summary   Patient: [name] MRN: [id number] DOB: 11-03-1945  Admit date:     09/27/2023  Discharge date: 09/28/23  Discharge Physician: Eric Marker, DO   PCP: [name] LABOR, MD   Recommendations at discharge:   Follow-up with PCP within 1 to 2 weeks repeat CBC, CMP, mag, Phos within 1 week Follow-up with neurology in outpatient setting  Discharge Diagnoses: Principal Problem:   Acute CVA (cerebrovascular accident) [facility]) Active Problems:   Essential hypertension   Hyperlipidemia   Chronic kidney disease, stage 3a (HCC)   GERD (gastroesophageal reflux disease)   OSA on CPAP   Benign prostatic hyperplasia with lower urinary tract symptoms   Leg cramping  Resolved Problems:   Alcoholic cirrhosis [facility])  Hospital Course: HPI per [name]:  [name] is a 78 y.o. male with medical history significant of hx of CVA, GERD, HTN, HLD, OSA on cpap, CKD stage 3a, BPH, GERD who presented to Ed with complaints of left sided numbness. Around 1am he got up to go to the bathroom. He noticed his left arm felt heavy. He sat down on the toilet and when he tried to get up he couldn't pull up his PJ bottoms. He then had tingling in his left arm and tingling in the left side of his face/back of head that turned into numbness. He had this with his previous stroke over 20 years ago. He has maybe some mild weakness in his left lower leg that he may have noticed skiing, but overall he does well. He noticed no weakness in his lower leg. He had no drooping or slurred speech. His wife brought him to ED within an hour of symptoms. His strength is back in his LUE, still has decreased sensation over left side of his face and back of scalp.    He is on ASA 325mg  daily. Family history of stroke in his mother.    He has been feeling good. Denies any fever/chills, vision changes/headaches, chest pain or palpitations, shortness of breath or cough, abdominal pain, N/V/D,  dysuria or leg swelling.  He does complain of leg cramps in the morning. No claudication.    He does not smoke or drink alcohol.    ER Course:  vitals: afebrile, bp: 188/87, HR: 58, RR: 17, oxygen: 100%RA Pertinent labs: creatinine: 1.5,  CT head: no acute finding MRI brain: 1. Subtle foci of diffusion signal abnormality involving the posterior right frontoparietal region, pre and postcentral gyri, consistent with small acute ischemic nonhemorrhagic infarcts. 2. Underlying mild chronic microvascular ischemic disease with a few remote lacunar infarcts about the right basal ganglia. CTA head/neck: no LVO.  Head and neck Atherosclerosis most pronounced at:- Left ICA origin/bulb with bulky soft plaque, but no significant stenosis. - codominant Right Vertebral Artery V4 segment, with moderate to Severe stenosis.3. Generalized arterial tortuosity. In ED: code stroke called, neurology following. [facility] asked to admit.    **Interim History Neurology was consulted and he underwent stroke workup he was placed on dual antiplatelet therapy with aspirin  81 and clopidogrel  75 for 3 weeks and then recommending Plavix  alone.  He is being ordered for a 30-day monitor and will follow-up in outpatient setting with EP for loop recorder if 30-day monitor is negative   Assessment and Plan:  Acute CVA (cerebrovascular accident) [facility]) 78 year old male who presented to ED with left facial numbness and UE weakness found to have a subtle foci of diffusion signal abnormality involving the posterior  right frontoparietal region, pre and postcentral gyri, consistent with small acute ischemic nonhemorrhagic infarcts by MRI.  -admit to progressive for acute CVA on tele  -Neurochecks per protocol -Neurology consulted and recommended dual antiplatelet therapy -echo showed EF of 55 to 60% -CTA head/neck with no LVO  -fasting LDL 77, increase lipitor to 40mg  daily  -A1C of 6.5  -on ASA 325mg  daily, anti platelets per  neurology  -Permissive hypertension first 24 hours <220/110 -consider LT cardiac monitor  -passed bedside swallow screen  -PT/ OT/ SLP consult recommended no follow-up given the patient is a baseline -left LLE weakness at baseline from previous CVA  -Neurology recommended dual antiplatelet therapy with aspirin  and Plavix  for 3 weeks and then just Plavix  monotherapy as well as an outpatient 30-day monitor with discussion for loop recorder monitor is negative   Essential hypertension Allow for permissive HTN of 220/120 for first 24 hours  Home medication includes diovan-hct and toprol -xl, hold for now  PRN IV medication for parameters above    Hyperlipidemia Fasting lipid panel: LDL 77  Increase lipitor to 40mg  daily    Chronic kidney disease, stage 3a (HCC) Baseline creatinine appears to be around 1.5 Stable, continue to monitor  -BUN/Cr Trend: Recent Labs  Lab 09/27/23 0225 09/27/23 0227 09/28/23 0551  BUN 19 17 15   CREATININE 1.70* 1.54* 1.42*  -Avoid Nephrotoxic Medications, Contrast Dyes, Hypotension and Dehydration to Ensure Adequate Renal Perfusion and will need to Renally Adjust Meds -Continue to Monitor and Trend Renal Function carefully and repeat CMP within 1 weel    GERD (gastroesophageal reflux disease) Continue PPI PRN    OSA on CPAP Cpap nightly    Benign prostatic hyperplasia with lower urinary tract symptoms Continue flomax     Leg cramping Check CK, TSH and B12 Increase water , stretch at night    Hypoalbuminemia -Patient's Albumin Trend: Recent Labs  Lab 09/27/23 0227 09/28/23 0551  ALBUMIN 3.4* 3.0*  -Continue to Monitor and Trend and repeat CMP in the AM  Consultants: Neurology Procedures performed: Echocardiogram and lower extremity venous duplex Disposition: Home Diet recommendation:  Cardiac diet DISCHARGE MEDICATION: Allergies as of 09/28/2023       Reactions   Vistaril [hydroxyzine] Other (See Comments)   Unknown reaction   Zoloft  [sertraline] Other (See Comments)   Erectile dysfunction        Medication List     STOP taking these medications    aspirin  325 MG tablet Replaced by: aspirin  EC 81 MG tablet   omeprazole 20 MG tablet Commonly known as: PRILOSEC OTC Replaced by: pantoprazole  40 MG tablet       TAKE these medications    acetaminophen  500 MG tablet Commonly known as: TYLENOL  Take 1,000 mg by mouth 2 (two) times daily as needed for moderate pain (pain score 4-6).   aspirin  EC 81 MG tablet Take 1 tablet (81 mg total) by mouth daily. Swallow whole. Replaces: aspirin  325 MG tablet   atorvastatin  40 MG tablet Commonly known as: LIPITOR Take 1 tablet (40 mg total) by mouth daily. What changed:  medication strength how much to take   azelastine 0.1 % nasal spray Commonly known as: ASTELIN Place 1 spray into the nose daily as needed for allergies or rhinitis.   clopidogrel  75 MG tablet Commonly known as: PLAVIX  Take 1 tablet (75 mg total) by mouth daily.   fluticasone 50 MCG/ACT nasal spray Commonly known as: FLONASE Place 2 sprays into both nostrils daily as needed for allergies.  GARLIC PO Take 1 tablet by mouth daily.   metoprolol  succinate 50 MG 24 hr tablet Commonly known as: TOPROL -XL Take 50 mg by mouth daily.   montelukast 10 MG tablet Commonly known as: SINGULAIR Take 10 mg by mouth daily as needed (allergies).   ondansetron  4 MG tablet Commonly known as: ZOFRAN  Take 1 tablet (4 mg total) by mouth every 6 (six) hours as needed for nausea.   pantoprazole  40 MG tablet Commonly known as: PROTONIX  Take 1 tablet (40 mg total) by mouth daily. Replaces: omeprazole 20 MG tablet   tamsulosin  0.4 MG Caps capsule Commonly known as: FLOMAX  Take 0.4 mg by mouth at bedtime.   valsartan-hydrochlorothiazide 160-25 MG tablet Commonly known as: DIOVAN-HCT Take 0.5 tablets by mouth daily.   Vitamin D 50 MCG (2000 UT) tablet Take 2,000 Units by mouth daily.         Follow-up Information     Penumalli, Eduard SAUNDERS, MD. Schedule an appointment as soon as possible for a visit in 1 month(s).   Specialties: Neurology, Radiology Contact information: [address] [place] [address] [phone]                Discharge Exam: Fredricka Weights   09/27/23 1700  Weight: 87.2 kg   Vitals:   09/27/23 2333 09/28/23 0354  BP: (!) 140/70 (!) 140/73  Pulse: (!) 59 (!) 58  Resp: 18 18  Temp: (!) 97.4 F (36.3 C) 98.3 F (36.8 C)  SpO2: 96% 97%   Examination: Physical Exam:  Constitutional: WN/WD obese African-American male in no acute distress Respiratory: Clear to auscultation bilaterally, no wheezing, rales, rhonchi or crackles. Normal respiratory effort and patient is not tachypenic. No accessory muscle use.  Cardiovascular: RRR, no murmurs / rubs / gallops. S1 and S2 auscultated. No extremity edema. 2+ pedal pulses. No carotid bruits.  Abdomen: Soft, non-tender, distended secondary to body habitus. Bowel sounds positive.  GU: Deferred. Musculoskeletal: No clubbing / cyanosis of digits/nails. No joint deformity upper and lower extremities.  Skin: No rashes, lesions, ulcers on a limited skin evaluation. No induration; Warm and dry.  Neurologic: CN 2-12 grossly intact with no focal deficits.  Has diminished sensation in the left arm Psychiatric: Normal judgment and insight. Alert and oriented x 3. Normal mood and appropriate affect.   Condition at discharge: stable  The results of significant diagnostics from this hospitalization (including imaging, microbiology, ancillary and laboratory) are listed below for reference.   Imaging Studies: VAS US  LOWER EXTREMITY VENOUS (DVT) Result Date: 09/27/2023  Lower Venous DVT Study Patient Name:  RUVIM RISKO [facility]  Date of Exam:   09/27/2023 Medical Rec #: [id number]         Accession #:    [id number] Date of Birth: Jan 09, 1946         Patient Gender: M Patient Age:   65 years Exam Location:  [facility] Procedure:      VAS US  LOWER EXTREMITY VENOUS (DVT) Referring Phys: ARY XU --------------------------------------------------------------------------------  Indications: Stroke.  Comparison Study: No previous exams Performing Technologist: Jody Hill RVT, RDMS  Examination Guidelines: A complete evaluation includes B-mode imaging, spectral Doppler, color Doppler, and power Doppler as needed of all accessible portions of each vessel. Bilateral testing is considered an integral part of a complete examination. Limited examinations for reoccurring indications may be performed as noted. The reflux portion of the exam is performed with the patient in reverse Trendelenburg.  +---------+---------------+---------+-----------+----------+-------------------+ RIGHT    CompressibilityPhasicitySpontaneityPropertiesThrombus Aging      +---------+---------------+---------+-----------+----------+-------------------+  CFV      Full           Yes      Yes                                      +---------+---------------+---------+-----------+----------+-------------------+ SFJ      Full                                                             +---------+---------------+---------+-----------+----------+-------------------+ FV Prox  Full           Yes      Yes                                      +---------+---------------+---------+-----------+----------+-------------------+ FV Mid   Full           Yes      Yes                                      +---------+---------------+---------+-----------+----------+-------------------+ FV DistalFull           Yes      Yes                                      +---------+---------------+---------+-----------+----------+-------------------+ PFV      Full                                                             +---------+---------------+---------+-----------+----------+-------------------+ POP      Full           Yes      Yes                                       +---------+---------------+---------+-----------+----------+-------------------+ PTV      Full                                                             +---------+---------------+---------+-----------+----------+-------------------+ PERO     Full                                         Not well visualized +---------+---------------+---------+-----------+----------+-------------------+   +---------+---------------+---------+-----------+----------+-------------------+ LEFT     CompressibilityPhasicitySpontaneityPropertiesThrombus Aging      +---------+---------------+---------+-----------+----------+-------------------+ CFV      Full           Yes      Yes                                      +---------+---------------+---------+-----------+----------+-------------------+  SFJ      Full                                                             +---------+---------------+---------+-----------+----------+-------------------+ FV Prox  Full           Yes      Yes                                      +---------+---------------+---------+-----------+----------+-------------------+ FV Mid   Full           Yes      Yes                                      +---------+---------------+---------+-----------+----------+-------------------+ FV DistalFull           Yes      Yes                                      +---------+---------------+---------+-----------+----------+-------------------+ PFV      Full                                                             +---------+---------------+---------+-----------+----------+-------------------+ POP      Full           Yes      Yes                                      +---------+---------------+---------+-----------+----------+-------------------+ PTV                                                   Not well visualized  +---------+---------------+---------+-----------+----------+-------------------+ PERO     Full                                                             +---------+---------------+---------+-----------+----------+-------------------+     Summary: BILATERAL: - No evidence of deep vein thrombosis seen in the lower extremities, bilaterally. -No evidence of popliteal cyst, bilaterally.   *See table(s) above for measurements and observations. Electronically signed by Fonda Rim on 09/27/2023 at 4:50:48 PM.    Final    ECHOCARDIOGRAM COMPLETE Result Date: 09/27/2023    ECHOCARDIOGRAM REPORT   Patient Name:   MIA WINTHROP [facility] Date of Exam: 09/27/2023 Medical Rec #:  [id number]        Height:       65.0 in Accession #:    [id number]  Weight:       190.0 lb Date of Birth:  01/31/1946        BSA:          1.935 m Patient Age:    77 years         BP:           123/71 mmHg Patient Gender: M                HR:           60 bpm. Exam Location:  Inpatient Procedure: 2D Echo, Cardiac Doppler and Color Doppler Indications:    Stroke I63.9  History:        Patient has no prior history of Echocardiogram examinations.                 Stroke; Risk Factors:Hypertension and Dyslipidemia.  Sonographer:    Tinnie Gosling RDCS Referring Phys: [id number] ARY XU IMPRESSIONS  1. Left ventricular ejection fraction, by estimation, is 55 to 60%. The left ventricle has normal function. Left ventricular endocardial border not optimally defined to evaluate regional wall motion. Left ventricular diastolic parameters are consistent with Grade I diastolic dysfunction (impaired relaxation).  2. Right ventricular systolic function is normal. The right ventricular size is normal. Tricuspid regurgitation signal is inadequate for assessing PA pressure.  3. The mitral valve is normal in structure. No evidence of mitral valve regurgitation. No evidence of mitral stenosis.  4. The aortic valve was not well visualized. There is moderate  calcification of the aortic valve. Aortic valve regurgitation is not visualized. No aortic stenosis is present.  5. The IVC was not visualized.  6. Technically difficult study with poor acoustic windows. TE FINDINGS  Left Ventricle: Left ventricular ejection fraction, by estimation, is 55 to 60%. The left ventricle has normal function. Left ventricular endocardial border not optimally defined to evaluate regional wall motion. The left ventricular internal cavity size was normal in size. There is no left ventricular hypertrophy. Left ventricular diastolic parameters are consistent with Grade I diastolic dysfunction (impaired relaxation). Right Ventricle: The right ventricular size is normal. No increase in right ventricular wall thickness. Right ventricular systolic function is normal. Tricuspid regurgitation signal is inadequate for assessing PA pressure. Left Atrium: Left atrial size was normal in size. Right Atrium: Right atrial size was normal in size. Pericardium: There is no evidence of pericardial effusion. Mitral Valve: The mitral valve is normal in structure. No evidence of mitral valve regurgitation. No evidence of mitral valve stenosis. Tricuspid Valve: The tricuspid valve is normal in structure. Tricuspid valve regurgitation is not demonstrated. Aortic Valve: The aortic valve was not well visualized. There is moderate calcification of the aortic valve. Aortic valve regurgitation is not visualized. No aortic stenosis is present. Pulmonic Valve: The pulmonic valve was normal in structure. Pulmonic valve regurgitation is not visualized. Aorta: The aortic root is normal in size and structure. Venous: The inferior vena cava was not well visualized. IAS/Shunts: No atrial level shunt detected by color flow Doppler.  LEFT VENTRICLE PLAX 2D LVIDd:         3.85 cm   Diastology LVIDs:         3.40 cm   LV e' medial:    7.94 cm/s LV PW:         0.95 cm   LV E/e' medial:  9.5 LV IVS:        0.95 cm   LV e' lateral:    7.62 cm/s LVOT  diam:     1.80 cm   LV E/e' lateral: 9.9 LV SV:         50 LV SV Index:   26 LVOT Area:     2.54 cm  RIGHT VENTRICLE RV S prime:     11.40 cm/s TAPSE (M-mode): 1.9 cm LEFT ATRIUM           Index LA diam:      3.20 cm 1.65 cm/m LA Vol (A4C): 19.0 ml 9.82 ml/m  AORTIC VALVE LVOT Vmax:   101.00 cm/s LVOT Vmean:  70.300 cm/s LVOT VTI:    0.195 m  AORTA Ao Root diam: 3.20 cm MITRAL VALVE MV Area (PHT): 3.77 cm    SHUNTS MV Decel Time: 201 msec    Systemic VTI:  0.20 m MV E velocity: 75.40 cm/s  Systemic Diam: 1.80 cm MV A velocity: 96.80 cm/s MV E/A ratio:  0.78 Dalton McleanMD Electronically signed by Ezra Kanner Signature Date/Time: 09/27/2023/2:06:36 PM    Final    CT ANGIO HEAD NECK W WO CM W PERF (CODE STROKE) Result Date: 09/27/2023 CLINICAL DATA:  78 year old male code stroke presentation. Subtle diffusion abnormality in the posterior right MCA territory on MRI this morning. EXAM: CT ANGIOGRAPHY HEAD AND NECK CT PERFUSION BRAIN TECHNIQUE: Multidetector CT imaging of the head and neck was performed using the standard protocol during bolus administration of intravenous contrast. Multiplanar CT image reconstructions and MIPs were obtained to evaluate the vascular anatomy. Carotid stenosis measurements (when applicable) are obtained utilizing NASCET criteria, using the distal internal carotid diameter as the denominator. Multiphase CT imaging of the brain was performed following IV bolus contrast injection. Subsequent parametric perfusion maps were calculated using RAPID software. RADIATION DOSE REDUCTION: This exam was performed according to the departmental dose-optimization program which includes automated exposure control, adjustment of the mA and/or kV according to patient size and/or use of iterative reconstruction technique. CONTRAST:  OMNIPAQUE  IOHEXOL  350 MG/ML SOLN COMPARISON:  Brain MRI 0258 hours.  Head CT 0228 hours. FINDINGS: CT Brain Perfusion Findings: ASPECTS: 10 at 0228  hours. CBF (<30%) Volume: 0mL Perfusion (Tmax>6.0s) volume: 0mL Mismatch Volume: Not applicable Infarction Location:Not applicable No perfusion parameter abnormality is detected. CTA NECK Skeleton: No acute osseous abnormality identified. Ordinary cervical spine degeneration. Upper chest: Mild atelectasis. Other neck: Subcentimeter thyroid  nodules Not clinically significant; no follow-up imaging recommended (ref: J Am Coll Radiol. 2015 Feb;12(2): 143-50). Other neck soft tissue spaces are within normal limits. Aortic arch: 3 vessel arch. Mild arch tortuosity and calcified atherosclerosis. Generally tortuous proximal great vessels. Right carotid system: Tortuous brachiocephalic artery and proximal right CCA with no plaque or stenosis. Mild calcified more so than soft plaque at the right carotid bifurcation with no significant stenosis. Left carotid system: Mildly tortuous left CCA with no plaque or stenosis. Moderate soft more so than calcified plaque at the posterior left ICA origin (series 6, image 103) and continues into the left ICA bulb (series 11, image 141). However, stenosis is less than 50 % with respect to the distal vessel. Distal to the bulb left ICA is negative to the skull base. Vertebral arteries: Tortuous right subclavian artery without plaque or stenosis. Mild calcified plaque adjacent to the right vertebral artery origin without stenosis on series 7, image 348. Right vertebral artery then patent and normal to the skull base. Tortuous proximal left subclavian artery and left vertebral artery origin without plaque or stenosis. Codominant left vertebral artery is patent and within normal limits to the  skull base. CTA HEAD Posterior circulation: Fairly codominant distal vertebral arteries with bulky right V4 segment soft and calcified plaque resulting in moderate to severe stenosis on series 10, image 147. slightly diminished caliber of the vessel distal to that segment. Both PICA origins remain normal.  Left V4 segment is patent with minimal plaque. Patent vertebrobasilar junction without stenosis. Patent basilar artery with mild irregularity, mild plaque but no significant stenosis. Patent SCA and PCA origins. Posterior communicating arteries are diminutive or absent. Bilateral PCA branches are within normal limits. Anterior circulation: Both ICA siphons are patent. Left siphon calcified plaque maximal at the distal cavernous segment with only mild stenosis. Right siphon mild calcified plaque with mild stenosis. Patent carotid termini. Normal MCA and ACA origins. Mild M1 and A1 tortuosity. Diminutive or absent anterior communicating artery. Bilateral ACA branches are within normal limits. Left MCA M1 segment and bifurcation are patent without stenosis. Left MCA branches are within normal limits. Right MCA M1 segment and trifurcation are tortuous, patent without stenosis. Right MCA branches are within normal limits. No convincing distal branch occlusion. Venous sinuses: Patent. Anatomic variants: None. Review of the MIP images confirms the above findings IMPRESSION: 1. Negative CT Perfusion. CTA is negative for large vessel occlusion, with no convincing Right MCA branch occlusion. 2. Head and neck Atherosclerosis most pronounced at: - Left ICA origin/bulb with bulky soft plaque, but no significant stenosis. - codominant Right Vertebral Artery V4 segment, with moderate to Severe stenosis. 3. Generalized arterial tortuosity. Electronically Signed   By: VEAR Hurst M.D.   On: 09/27/2023 05:10   MR BRAIN WO CONTRAST Result Date: 09/27/2023 CLINICAL DATA:  Initial evaluation for acute neuro deficit, stroke suspected. EXAM: MRI HEAD WITHOUT CONTRAST TECHNIQUE: Multiplanar, multiecho pulse sequences of the brain and surrounding structures were obtained without intravenous contrast. COMPARISON:  CT from earlier the same day. FINDINGS: Brain: Cerebral volume within normal limits. Scattered patchy T2/FLAIR hyperintensity  involving the periventricular deep white matter both cerebral hemispheres, consistent with chronic small vessel ischemic disease, mild for age. Few remote lacunar infarcts present about the right basal ganglia. Subtle serpiginous focus of diffusion signal abnormality seen involving the cortical gray matter of the high posterior right parietal lobe, postcentral gyrus (series 5, image 7), suspicious for a small acute ischemic infarct. Probable additional subtle patchy involvement of the precentral gyrus as well (same image). Suspected tiny subcortical involvement noted inferiorly (series 5, image 12). These changes are not well seen on corresponding coronal sequence. No associated hemorrhage or mass effect. No other evidence for acute or subacute ischemia. Gray-white matter differentiation otherwise maintained. No acute intracranial hemorrhage. Single punctate chronic microhemorrhage noted at the posterior left cerebral hemisphere, likely small vessel related. No mass lesion, midline shift or mass effect. No hydrocephalus or extra-axial fluid collection. Pituitary gland and suprasellar region within normal limits. Vascular: Major intracranial vascular flow voids are maintained. Skull and upper cervical spine: Craniocervical junction within normal limits. Bone marrow signal intensity normal. No scalp soft tissue abnormality. Sinuses/Orbits: Globes and orbital soft tissues within normal limits. Paranasal sinuses are largely clear. No significant mastoid effusion. Other: None. IMPRESSION: 1. Subtle foci of diffusion signal abnormality involving the posterior right frontoparietal region, pre and postcentral gyri, consistent with small acute ischemic nonhemorrhagic infarcts. 2. Underlying mild chronic microvascular ischemic disease with a few remote lacunar infarcts about the right basal ganglia. Electronically Signed   By: Morene Hoard M.D.   On: 09/27/2023 03:46   CT HEAD CODE STROKE WO CONTRAST Result  Date:  09/27/2023 CLINICAL DATA:  Code stroke. Initial evaluation for acute stroke, left-sided numbness. EXAM: CT HEAD WITHOUT CONTRAST TECHNIQUE: Contiguous axial images were obtained from the base of the skull through the vertex without intravenous contrast. RADIATION DOSE REDUCTION: This exam was performed according to the departmental dose-optimization program which includes automated exposure control, adjustment of the mA and/or kV according to patient size and/or use of iterative reconstruction technique. COMPARISON:  None Available. FINDINGS: Brain: Cerebral volume within normal limits. No acute intracranial hemorrhage. No acute large vessel territory infarct. No mass lesion or midline shift. No extra-axial fluid collection. Vascular: No convincing hyperdense vessel. Scattered vascular calcifications noted within the carotid siphons. Skull: Scalp soft tissues within normal limits for age calvarium intact. Sinuses/Orbits: Globes and orbital soft tissues within normal limits. Paranasal sinuses are clear. No mastoid effusion. Other: None. ASPECTS [facility] Stroke Program Early CT Score) - Ganglionic level infarction (caudate, lentiform nuclei, internal capsule, insula, M1-M3 cortex): 7 - Supraganglionic infarction (M4-M6 cortex): 3 Total score (0-10 with 10 being normal): 10 IMPRESSION: 1. Negative head CT.  No acute intracranial abnormality. 2. ASPECTS is 10. These results were communicated to Dr. Arora at 2:38 am on 09/27/2023 by text page via the [facility] messaging system. Electronically Signed   By: Morene Hoard M.D.   On: 09/27/2023 02:38   Microbiology: Results for orders placed or performed in visit on 05/08/11  Surgical pcr screen     Status: None   Collection Time: 05/08/11  9:05 AM  Result Value Ref Range Status   MRSA, PCR NEGATIVE NEGATIVE Final   Staphylococcus aureus NEGATIVE NEGATIVE Final    Comment:        The Xpert SA Assay (FDA approved for NASAL specimens only), is one component of a  comprehensive surveillance program.  It is not intended to diagnose infection nor to guide or monitor treatment.   Labs: CBC: Recent Labs  Lab 09/27/23 0225 09/27/23 0227 09/28/23 0551  WBC  --  6.7 5.5  NEUTROABS  --  3.0 3.1  HGB 13.3 12.3* 11.8*  HCT 39.0 38.5* 36.5*  MCV  --  84.2 82.2  PLT  --  172 162   Basic Metabolic Panel: Recent Labs  Lab 09/27/23 0225 09/27/23 0227 09/28/23 0551  NA 140 138 139  K 3.9 3.8 3.8  CL 104 104 106  CO2  --  24 24  GLUCOSE 113* 117* 110*  BUN 19 17 15   CREATININE 1.70* 1.54* 1.42*  CALCIUM   --  9.1 9.0  MG  --   --  1.7  PHOS  --   --  3.2   Liver Function Tests: Recent Labs  Lab 09/27/23 0227 09/28/23 0551  AST 29 21  ALT 34 24  ALKPHOS 57 46  BILITOT 0.8 1.1  PROT 6.9 6.0*  ALBUMIN 3.4* 3.0*   CBG: Recent Labs  Lab 09/27/23 0217  GLUCAP 109*   Discharge time spent: greater than 30 minutes.  Signed: Alejandro Marker, DO [facility] 10/01/2023

## 2023-09-28 NOTE — Care Management CC44 (Signed)
 Condition Code 44 Documentation Completed  Patient Details  Name: [name] MRN: [id number] Date of Birth: [birth year]   Condition Code 44 given:  Yes Patient signature on Condition Code 44 notice:  Yes Documentation of 2 MD's agreement:  Yes Code 44 added to claim:  Yes    Marval Gell, RN 09/28/2023, 3:25 PM

## 2023-09-28 NOTE — Progress Notes (Signed)
 PT Cancellation/Sign off Note  Patient Details Name: [name] MRN: [id number] DOB: [birth year]   Cancelled Treatment:    Reason Eval/Treat Not Completed: PT screened, no needs identified, will sign off. Discussed pt with OT who performed a comprehensive evaluation.  They report pt has no issues with mobility and despite some LUE decreased sensation is near baseline level of function and a PT eval is not necessary at this time.  IF this were to change please re-order PT consult.  Thanks!    Delos Oneil PARAS 09/28/2023, 10:48 AM Oneil Delos, PT   Acute Rehabilitation Services  Office [phone] 09/28/2023

## 2023-09-28 NOTE — Progress Notes (Signed)
 Removed IV, Reviewed and discussed new Medication, Returned personal belongings, Read AVS and discussed Stroke book, Removed and returned Telemetry Monitor, Transported patient down to the ED where his wife is in Room 28 for further neuro evaluation. Patient discharged.

## 2023-09-28 NOTE — Evaluation (Signed)
 Speech Language Pathology Evaluation Patient Details Name: [name] MRN: [id number] DOB: [birth year] Today's Date: 09/28/2023 Time: 8690-8669 SLP Time Calculation (min) (ACUTE ONLY): 21 min  Problem List:  Patient Active Problem List   Diagnosis Date Noted   Chronic kidney disease, stage 3a (HCC) 09/27/2023   Benign prostatic hyperplasia with lower urinary tract symptoms 09/27/2023   GERD (gastroesophageal reflux disease) 09/27/2023   Acute CVA (cerebrovascular accident) (HCC) 09/27/2023   OSA on CPAP 09/27/2023   Leg cramping 09/27/2023   Chest pain 12/06/2014   Dyspnea 12/06/2014   Essential hypertension 12/06/2014   Hyperlipidemia 12/06/2014   Palpitations 12/06/2014   Past Medical History:  Past Medical History:  Diagnosis Date   Anal fissure    Arthritis    hands,knees, ankles   CKD (chronic kidney disease)    CVA (cerebral infarction)    GERD (gastroesophageal reflux disease)    Hemorrhoids    Hyperlipidemia    Hypertension    Lesion of vocal cord    Pre-diabetes    diet controlled   PTSD (post-traumatic stress disorder)    Sinus drainage    post nasal drip from sinuses occasional.   Sleep apnea    uses CPAP   Stroke (HCC) 1997   slight weakness left side-not a problem.   Past Surgical History:  Past Surgical History:  Procedure Laterality Date   ANAL FISSURE REPAIR  05/11/2011   COLONOSCOPY WITH PROPOFOL  N/A 02/06/2016   Procedure: COLONOSCOPY WITH PROPOFOL ;  Surgeon: Gladis MARLA Louder, MD;  Location: [facility];  Service: Endoscopy;  Laterality: N/A;   HEMORRHOID SURGERY     HERNIA REPAIR     Laryngoscopy Flexible Diagnostic  05/21/2023   in office w/dr bates with numbing medicine, no anesthesia   MICROLARYNGOSCOPY WITH CO2 LASER AND EXCISION OF VOCAL CORD LESION Bilateral 07/20/2022   Procedure: MICROLARYNGOSCOPY WITH CO2 LASER AND EXCISION OF VOCAL CORD LESION;  Surgeon: Carlie Clark, MD;  Location: [facility];  Service: ENT;   Laterality: Bilateral;   MICROLARYNGOSCOPY WITH CO2 LASER AND EXCISION OF VOCAL CORD LESION N/A 08/29/2023   Procedure: SUSPENSION MICRO DIRECT LARYNGOSCOPY WITH  biopsy;  Surgeon: Carlie Clark, MD;  Location: [facility] OR;  Service: ENT;  Laterality: N/A;   ROTATOR CUFF REPAIR  08/20/2008   left   HPI:  [name] is a 78 y.o. male with medical history significant of hx of CVA, GERD, HTN, HLD, OSA on cpap, CKD stage 3a, BPH, GERD who presented to Ed with complaints of left facial numbness and UE weakness. MRI revealed small acute infarcts in the right frontoparietal cortex, pre and post central gyri   Assessment / Plan / Recommendation Clinical Impression  Pt seen for skilled ST services for cognitive-linguistic evaluation, pt does not present with any residual speech-language/cognitive impairments. The pt reports not recalling any aphasiac speech at the time of the CVA but that his memory of the event is generally poor. The pt also reports that his attention skills may be impaired due to concern for his wife who had a medical emergency whilst visiting him. The pt reports a full return to baseline for cognition and speech. The pt was given elements from the MS aphasia screening test and the SLUMs for assessment. The pt had 100% success with naming, repetition, spontaneous conversation. The pts speech was clear and intelligible. The pt was A&O x4, he displayed adequate delayed recall and executive function skills. SLP assisted the pt in ordering lunch due to phone  issues, pt displayed great problem-solving skills. The pt reports independence with all iADLs and no concern for managing those upon d/c; SLP provided brief education on ST intervention if any cognitive concerns arise while hospitalized or upon d/c. No further acute needs, no ST f/u. SLP signing off. Please reconsult if further needs arise.    SLP Assessment  SLP Recommendation/Assessment: Patient does not need any further Speech Lanaguage  Pathology Services SLP Visit Diagnosis: Cognitive communication deficit (R41.841)    Recommendations for follow up therapy are one component of a multi-disciplinary discharge planning process, led by the attending physician.  Recommendations may be updated based on patient status, additional functional criteria and insurance authorization.    Follow Up Recommendations       Assistance Recommended at Discharge  None  Functional Status Assessment Patient has had a recent decline in their functional status and demonstrates the ability to make significant improvements in function in a reasonable and predictable amount of time.  Frequency and Duration           SLP Evaluation Cognition  Overall Cognitive Status: Within Functional Limits for tasks assessed Arousal/Alertness: Awake/alert Orientation Level: Oriented X4 Year: 2025 Month: February Day of Week: Correct Attention: Focused Focused Attention: Appears intact Memory: Appears intact Awareness: Appears intact Problem Solving: Appears intact Executive Function: Reasoning;Decision Making;Self Monitoring;Sequencing Reasoning: Appears intact Sequencing: Appears intact Decision Making: Appears intact Self Monitoring: Appears intact Safety/Judgment: Appears intact       Comprehension  Auditory Comprehension Overall Auditory Comprehension: Appears within functional limits for tasks assessed Yes/No Questions: Within Functional Limits Commands: Within Functional Limits Conversation: Complex    Expression Expression Primary Mode of Expression: Verbal Verbal Expression Overall Verbal Expression: Appears within functional limits for tasks assessed Initiation: No impairment Automatic Speech: Social Response Level of Generative/Spontaneous Verbalization: Conversation Repetition: No impairment Naming: No impairment Written Expression Dominant Hand: Right Written Expression: Not tested   Oral / Motor  Oral Motor/Sensory  Function Overall Oral Motor/Sensory Function: Within functional limits Motor Speech Overall Motor Speech: Appears within functional limits for tasks assessed Respiration: Within functional limits Phonation: Normal Resonance: Within functional limits Articulation: Within functional limitis Intelligibility: Intelligible Motor Planning: Witnin functional limits Motor Speech Errors: Not applicable           Manuelita Blew M.S. CCC-SLP

## 2023-09-28 NOTE — Care Management Obs Status (Signed)
 MEDICARE OBSERVATION STATUS NOTIFICATION   Patient Details  Name: [name] MRN: [id number] Date of Birth: Dec 01, 1945   Medicare Observation Status Notification Given:  Yes    Omie Bickers, RN 09/28/2023, 1:56 PM

## 2023-09-28 NOTE — Plan of Care (Signed)
  Problem: Education: Goal: Knowledge of disease or condition will improve 09/28/2023 0018 by Wallace Izetta PARAS, RN Outcome: Progressing 09/27/2023 2226 by Wallace Izetta PARAS, RN Outcome: Progressing   Problem: Education: Goal: Knowledge of patient specific risk factors will improve (DELETE if not current risk factor) 09/28/2023 0018 by Wallace Izetta PARAS, RN Outcome: Progressing 09/27/2023 2226 by Wallace Izetta PARAS, RN Outcome: Progressing   Problem: Ischemic Stroke/TIA Tissue Perfusion: Goal: Complications of ischemic stroke/TIA will be minimized 09/28/2023 0018 by Wallace Izetta PARAS, RN Outcome: Progressing 09/27/2023 2226 by Wallace Izetta PARAS, RN Outcome: Progressing   Problem: Coping: Goal: Will identify appropriate support needs 09/28/2023 0018 by Wallace Izetta PARAS, RN Outcome: Progressing 09/27/2023 2226 by Wallace Izetta PARAS, RN Outcome: Progressing

## 2023-09-28 NOTE — Evaluation (Signed)
 Occupational Therapy Evaluation Patient Details Name: [name] MRN: [id number] DOB: April 07, 1946 Today's Date: 09/28/2023   History of Present Illness [name] is a 78 y.o. male with medical history significant of hx of CVA, GERD, HTN, HLD, OSA on cpap, CKD stage 3a, BPH, GERD who presented to Ed with complaints of left facial numbness and UE weakness. MRI revealed small acute infarcts in the right frontoparietal cortex, pre and post central gyri   Clinical Impression   PTA, pt reports he was independent with ADL/IADL and functional mobility. Pt reports he plays golf 3x/week and goes to the gym 2x/week. Pt currently reports feeling close to baseline despite decreased sensation in forearm of LUE. He demonstrates 5/5 strength grossly in LUE. Pt demonstrates ability to ambulate independently and complete ADL independently. Patient evaluated by Occupational Therapy with no further acute OT needs identified. All education has been completed and the patient has no further questions. See below for any follow-up Occupational Therapy or equipment needs. OT to sign off. Thank you for referral.         If plan is discharge home, recommend the following:  No recommendations    Functional Status Assessment  Patient has not had a recent decline in their functional status  Equipment Recommendations  None recommended by OT    Recommendations for Other Services       Precautions / Restrictions Precautions Precautions: Fall Restrictions Weight Bearing Restrictions Per Provider Order: No      Mobility Bed Mobility Overal bed mobility: Independent                  Transfers Overall transfer level: Independent                        Balance Overall balance assessment: Independent                                         ADL either performed or assessed with clinical judgement   ADL Overall ADL's : Independent                                              Vision Baseline Vision/History: 0 No visual deficits Ability to See in Adequate Light: 0 Adequate Patient Visual Report: No change from baseline       Perception         Praxis         Pertinent Vitals/Pain Pain Assessment Pain Assessment: No/denies pain     Extremity/Trunk Assessment Upper Extremity Assessment Upper Extremity Assessment: LUE deficits/detail LUE Deficits / Details: strength 5/5, pt reports tingling in forearm and   Lower Extremity Assessment Lower Extremity Assessment: Overall WFL for tasks assessed   Cervical / Trunk Assessment Cervical / Trunk Assessment: Normal   Communication Communication Communication: No apparent difficulties   Cognition Arousal: Alert Behavior During Therapy: WFL for tasks assessed/performed Overall Cognitive Status: Within Functional Limits for tasks assessed                                       General Comments  vss on RA    Exercises Exercises: Other exercises Other Exercises Other  Exercises: educated pt on BE FAST   Shoulder Instructions      Home Living Family/patient expects to be discharged to:: Private residence Living Arrangements: Spouse/significant other Available Help at Discharge: Family Type of Home: House Home Access: Level entry     Home Layout: One level     Bathroom Shower/Tub: Producer, Television/film/video: Standard Bathroom Accessibility: Yes   Home Equipment: Agricultural Consultant (2 wheels)          Prior Functioning/Environment Prior Level of Function : Independent/Modified Independent                        OT Problem List: Impaired sensation      OT Treatment/Interventions:      OT Goals(Current goals can be found in the care plan section) Acute Rehab OT Goals Patient Stated Goal: to go home today OT Goal Formulation: With patient Time For Goal Achievement: 10/12/23 Potential to Achieve Goals: Good  OT Frequency:       Co-evaluation              AM-PAC OT 6 Clicks Daily Activity     Outcome Measure Help from another person eating meals?: None Help from another person taking care of personal grooming?: None Help from another person toileting, which includes using toliet, bedpan, or urinal?: None Help from another person bathing (including washing, rinsing, drying)?: None Help from another person to put on and taking off regular upper body clothing?: None Help from another person to put on and taking off regular lower body clothing?: None 6 Click Score: 24   End of Session Nurse Communication: Mobility status  Activity Tolerance: Patient tolerated treatment well Patient left: in bed;with call bell/phone within reach  OT Visit Diagnosis: Other (comment) (decreased sensation)                Time: 8995-8966 OT Time Calculation (min): 29 min Charges:  OT General Charges $OT Visit: 1 Visit OT Evaluation $OT Eval Low Complexity: 1 Low OT Treatments $Self Care/Home Management : 8-22 mins  [name] Acute Rehabilitation Services Office: [phone]   [name] 09/28/2023, 10:42 AM

## 2023-09-30 ENCOUNTER — Encounter: Payer: Self-pay | Admitting: *Deleted

## 2023-09-30 ENCOUNTER — Other Ambulatory Visit: Payer: Self-pay | Admitting: Physician Assistant

## 2023-09-30 DIAGNOSIS — I440 Atrioventricular block, first degree: Secondary | ICD-10-CM

## 2023-09-30 DIAGNOSIS — I639 Cerebral infarction, unspecified: Secondary | ICD-10-CM

## 2023-09-30 NOTE — Progress Notes (Signed)
 Patient enrolled for [employer]/ [employer] to ship a 30 day cardiac event monitor to [address], [place], [place]  [address].  Dr. Arlester Ladd to read.

## 2023-10-01 NOTE — Hospital Course (Addendum)
 HPI per Dr. Isaiah Geralds:  [name] is a 78 y.o. male with medical history significant of hx of CVA, GERD, HTN, HLD, OSA on cpap, CKD stage 3a, BPH, GERD who presented to Ed with complaints of left sided numbness. Around 1am he got up to go to the bathroom. He noticed his left arm felt heavy. He sat down on the toilet and when he tried to get up he couldn't pull up his PJ bottoms. He then had tingling in his left arm and tingling in the left side of his face/back of head that turned into numbness. He had this with his previous stroke over 20 years ago. He has maybe some mild weakness in his left lower leg that he may have noticed skiing, but overall he does well. He noticed no weakness in his lower leg. He had no drooping or slurred speech. His wife brought him to ED within an hour of symptoms. His strength is back in his LUE, still has decreased sensation over left side of his face and back of scalp.    He is on ASA 325mg  daily. Family history of stroke in his mother.    He has been feeling good. Denies any fever/chills, vision changes/headaches, chest pain or palpitations, shortness of breath or cough, abdominal pain, N/V/D, dysuria or leg swelling.  He does complain of leg cramps in the morning. No claudication.    He does not smoke or drink alcohol.    ER Course:  vitals: afebrile, bp: 188/87, HR: 58, RR: 17, oxygen: 100%RA Pertinent labs: creatinine: 1.5,  CT head: no acute finding MRI brain: 1. Subtle foci of diffusion signal abnormality involving the posterior right frontoparietal region, pre and postcentral gyri, consistent with small acute ischemic nonhemorrhagic infarcts. 2. Underlying mild chronic microvascular ischemic disease with a few remote lacunar infarcts about the right basal ganglia. CTA head/neck: no LVO.  Head and neck Atherosclerosis most pronounced at:- Left ICA origin/bulb with bulky soft plaque, but no significant stenosis. - codominant Right Vertebral Artery V4  segment, with moderate to Severe stenosis.3. Generalized arterial tortuosity. In ED: code stroke called, neurology following. TR[facility] asked to admit.    **Interim History Neurology was consulted and he underwent stroke workup he was placed on dual antiplatelet therapy with aspirin  81 and clopidogrel  75 for 3 weeks and then recommending Plavix  alone.  He is being ordered for a 30-day monitor and will follow-up in outpatient setting with EP for loop recorder if 30-day monitor is negative   Assessment and Plan:  Acute CVA (cerebrovascular accident) (HCC) 78 year old male who presented to ED with left facial numbness and UE weakness found to have a subtle foci of diffusion signal abnormality involving the posterior right frontoparietal region, pre and postcentral gyri, consistent with small acute ischemic nonhemorrhagic infarcts by MRI.  -admit to progressive for acute CVA on tele  -Neurochecks per protocol -Neurology consulted and recommended dual antiplatelet therapy -echo showed EF of 55 to 60% -CTA head/neck with no LVO  -fasting LDL 77, increase lipitor to 40mg  daily  -A1C of 6.5  -on ASA 325mg  daily, anti platelets per neurology  -Permissive hypertension first 24 hours <220/110 -consider LT cardiac monitor  -passed bedside swallow screen  -Extremity duplex showed no evidence of DVT bilaterally -PT/ OT/ SLP consult recommended no follow-up given the patient is a baseline -left LLE weakness at baseline from previous CVA  -Neurology recommended dual antiplatelet therapy with aspirin  and Plavix  for 3 weeks and then just  Plavix  monotherapy as well as an outpatient 30-day monitor with discussion for loop recorder monitor is negative   Essential hypertension Allow for permissive HTN of 220/120 for first 24 hours  Home medication includes diovan-hct and toprol -xl, hold for now  PRN IV medication for parameters above    Hyperlipidemia Fasting lipid panel: LDL 77  Increase lipitor to 40mg  daily     Chronic kidney disease, stage 3a (HCC) Baseline creatinine appears to be around 1.5 Stable, continue to monitor  -BUN/Cr Trend: Recent Labs  Lab 09/27/23 0225 09/27/23 0227 09/28/23 0551  BUN 19 17 15   CREATININE 1.70* 1.54* 1.42*  -Avoid Nephrotoxic Medications, Contrast Dyes, Hypotension and Dehydration to Ensure Adequate Renal Perfusion and will need to Renally Adjust Meds -Continue to Monitor and Trend Renal Function carefully and repeat CMP within 1 weel    GERD (gastroesophageal reflux disease) Continue PPI PRN    OSA on CPAP Cpap nightly    Benign prostatic hyperplasia with lower urinary tract symptoms Continue flomax     Leg cramping Check CK, TSH and B12 Increase water , stretch at night    Hypoalbuminemia -Patient's Albumin Trend: Recent Labs  Lab 09/27/23 0227 09/28/23 0551  ALBUMIN 3.4* 3.0*  -Continue to Monitor and Trend and repeat CMP in the AM

## 2023-10-08 DIAGNOSIS — I440 Atrioventricular block, first degree: Secondary | ICD-10-CM | POA: Diagnosis not present

## 2023-10-08 DIAGNOSIS — I639 Cerebral infarction, unspecified: Secondary | ICD-10-CM | POA: Diagnosis not present

## 2023-10-22 ENCOUNTER — Encounter: Payer: Self-pay | Admitting: Diagnostic Neuroimaging

## 2023-10-22 ENCOUNTER — Ambulatory Visit: Payer: Self-pay | Admitting: Diagnostic Neuroimaging

## 2023-10-22 VITALS — BP 125/71 | HR 72 | Ht 65.0 in | Wt 197.0 lb

## 2023-10-22 DIAGNOSIS — E785 Hyperlipidemia, unspecified: Secondary | ICD-10-CM

## 2023-10-22 DIAGNOSIS — I63411 Cerebral infarction due to embolism of right middle cerebral artery: Secondary | ICD-10-CM

## 2023-10-22 MED ORDER — CLOPIDOGREL BISULFATE 75 MG PO TABS: 75.0000 mg | ORAL_TABLET | Freq: Every day | ORAL | 4 refills | Status: AC

## 2023-10-22 MED ORDER — ATORVASTATIN CALCIUM 40 MG PO TABS
40.0000 mg | ORAL_TABLET | Freq: Every day | ORAL | 4 refills | Status: AC
Start: 2023-10-22 — End: ?

## 2023-10-22 NOTE — Progress Notes (Signed)
 [facility]  PATIENT: [name] DOB: [birth year]  REFERRING CLINICIAN: Marvel Plan, MD HISTORY FROM: patient  REASON FOR VISIT: new consult   HISTORICAL  CHIEF COMPLAINT:  Chief Complaint  Patient presents with   Cerebrovascular Accident    RM 6 alone Pt is well and stable, reports no residual concerns.     HISTORY OF PRESENT ILLNESS:   78 year old male here for evaluation of stroke follow-up.  History of hypertension, hyperlipidemia, OSA on CPAP, presented to the hospital due to left-sided numbness in February 2025.  Found to have small acute infarct in the right frontoparietal region.  Concern for cardioembolic etiology was raised.  Stroke workup completed.  Currently has outpatient cardiac monitor for 30 days.  Has completed aspirin and Plavix for 3 weeks and now on Plavix alone.  Doing well.  Tolerating medications.   REVIEW OF SYSTEMS: Full 14 system review of systems performed and negative with exception of: as per HPI.  ALLERGIES: Allergies  Allergen Reactions   Vistaril [Hydroxyzine] Other (See Comments)    Unknown reaction   Zoloft [Sertraline] Other (See Comments)    Erectile dysfunction    HOME MEDICATIONS: Outpatient Medications Prior to Visit  Medication Sig Dispense Refill   acetaminophen (TYLENOL) 500 MG tablet Take 1,000 mg by mouth 2 (two) times daily as needed for moderate pain (pain score 4-6).     azelastine (ASTELIN) 0.1 % nasal spray Place 1 spray into the nose daily as needed for allergies or rhinitis.     Cholecalciferol (VITAMIN D) 50 MCG (2000 UT) tablet Take 2,000 Units by mouth daily.     fluticasone (FLONASE) 50 MCG/ACT nasal spray Place 2 sprays into both nostrils daily as needed for allergies.     GARLIC PO Take 1 tablet by mouth daily.     metoprolol succinate (TOPROL-XL) 50 MG 24 hr tablet Take 50 mg by mouth daily.     montelukast (SINGULAIR) 10 MG tablet Take 10 mg by mouth daily as needed (allergies).      ondansetron (ZOFRAN) 4 MG tablet Take 1 tablet (4 mg total) by mouth every 6 (six) hours as needed for nausea. 20 tablet 0   pantoprazole (PROTONIX) 40 MG tablet Take 1 tablet (40 mg total) by mouth daily. 30 tablet 0   tamsulosin (FLOMAX) 0.4 MG CAPS capsule Take 0.4 mg by mouth at bedtime.     valsartan-hydrochlorothiazide (DIOVAN-HCT) 160-25 MG tablet Take 0.5 tablets by mouth daily.     aspirin EC 81 MG tablet Take 1 tablet (81 mg total) by mouth daily. Swallow whole. 21 tablet 0   atorvastatin (LIPITOR) 40 MG tablet Take 1 tablet (40 mg total) by mouth daily. 30 tablet 0   clopidogrel (PLAVIX) 75 MG tablet Take 1 tablet (75 mg total) by mouth daily. 30 tablet 1   No facility-administered medications prior to visit.      PHYSICAL EXAM  GENERAL EXAM/CONSTITUTIONAL: Vitals:  Vitals:   10/22/23 0857  BP: 125/71  Pulse: 72  Weight: 197 lb (89.4 kg)  Height: 5\' 5"  (1.651 m)   Body mass index is 32.78 kg/m. Wt Readings from Last 3 Encounters:  10/22/23 197 lb (89.4 kg)  09/27/23 192 lb 3.9 oz (87.2 kg)  08/29/23 190 lb (86.2 kg)   Patient is in no distress; well developed, nourished and groomed; neck is supple  CARDIOVASCULAR: Examination of carotid arteries is normal; no carotid bruits Regular rate and rhythm, no murmurs Examination of peripheral vascular system by  observation and palpation is normal  EYES: Ophthalmoscopic exam of optic discs and posterior segments is normal; no papilledema or hemorrhages No results found.  MUSCULOSKELETAL: Gait, strength, tone, movements noted in Neurologic exam below  NEUROLOGIC: MENTAL STATUS:      No data to display         awake, alert, oriented to person, place and time recent and remote memory intact normal attention and concentration language fluent, comprehension intact, naming intact fund of knowledge appropriate  CRANIAL NERVE:  2nd - no papilledema on fundoscopic exam 2nd, 3rd, 4th, 6th - pupils equal and  reactive to light, visual fields full to confrontation, extraocular muscles intact, no nystagmus 5th - facial sensation symmetric 7th - facial strength symmetric 8th - hearing intact 9th - palate elevates symmetrically, uvula midline 11th - shoulder shrug symmetric 12th - tongue protrusion midline  MOTOR:  normal bulk and tone, full strength in the BUE, BLE  SENSORY:  normal and symmetric to light touch, temperature, vibration; SLIGHTLY DECR IN LEFT HAND NO EXTINCTION ON LEFT SIDE  COORDINATION:  finger-nose-finger, fine finger movements normal  REFLEXES:  deep tendon reflexes 1+ and symmetric; TRACE AT ANKLES  GAIT/STATION:  narrow based gait     DIAGNOSTIC DATA (LABS, IMAGING, TESTING) - I reviewed patient records, labs, notes, testing and imaging myself where available.  Lab Results  Component Value Date   WBC 5.5 09/28/2023   HGB 11.8 (L) 09/28/2023   HCT 36.5 (L) 09/28/2023   MCV 82.2 09/28/2023   PLT 162 09/28/2023      Component Value Date/Time   NA 139 09/28/2023 0551   K 3.8 09/28/2023 0551   CL 106 09/28/2023 0551   CO2 24 09/28/2023 0551   GLUCOSE 110 (H) 09/28/2023 0551   BUN 15 09/28/2023 0551   CREATININE 1.42 (H) 09/28/2023 0551   CALCIUM 9.0 09/28/2023 0551   PROT 6.0 (L) 09/28/2023 0551   ALBUMIN 3.0 (L) 09/28/2023 0551   AST 21 09/28/2023 0551   ALT 24 09/28/2023 0551   ALKPHOS 46 09/28/2023 0551   BILITOT 1.1 09/28/2023 0551   GFRNONAA 51 (L) 09/28/2023 0551   GFRAA >60 05/08/2011 0905   Lab Results  Component Value Date   CHOL 128 09/27/2023   HDL 39 (L) 09/27/2023   LDLCALC 77 09/27/2023   TRIG 58 09/27/2023   CHOLHDL 3.3 09/27/2023   Lab Results  Component Value Date   HGBA1C 6.5 (H) 09/27/2023   Lab Results  Component Value Date   VITAMINB12 280 09/27/2023   Lab Results  Component Value Date   TSH 0.863 09/27/2023      ASSESSMENT AND PLAN  78 y.o. year old male here with:   Dx:  1. Cerebrovascular accident  (CVA) due to embolism of right middle cerebral artery (HCC)     PLAN:  Stroke:  small acute infarcts in the right frontoparietal cortex, pre and post central gyri  Etiology:  concern for cardioembolic etiology  Code Stroke CT head No acute abnormality. ASPECTS 10.    CTA head & neck CTA is negative for large vessel occlusion, left ICA origin/bulb with bulky soft plaque, but no significant stenosis.codominant Right Vertebral Artery V4 segment, with moderate to Severe stenosis. MRI  Subtle foci of diffusion signal abnormality involving the posterior right frontoparietal region, pre and postcentral gyri, consistent with small acute ischemic nonhemorrhagic infarcts. DVT study-negative 2D echo EF 55 to 60% Recommend 30-day CardioNet monitor as outpatient to rule out A-fib.  If negative, recommend  loop recorder as outpatient. LDL 77  HgbA1c 6.5 aspirin 325 mg daily prior to admission, started on aspirin 81 mg daily and clopidogrel 75 mg daily for 3 weeks and now Plavix 75mg  alone long term   Hx of Stroke/TIA Stroke in 1997- numbness left side, left side weakness but recovered - remote lacunar infarcts noted in the right basal ganglia on neuroimaging   Palpitations  Hypertension Seen Dr. Jens Som in 2016 Home meds: metoprolol succinate, Valsartan-hydrochlorothiazide 30-day monitor --> ongoing; if negative then refer to EP outpatient for loop recorder.     Hyperlipidemia Home meds:  Atorvastatin 20 LDL 77, goal < 70 Increased Lipitor to 40 Continue statin at discharge   Other stroke risk factors OSA on CPAP --> doing well  Meds ordered this encounter  Medications   clopidogrel (PLAVIX) 75 MG tablet    Sig: Take 1 tablet (75 mg total) by mouth daily.    Dispense:  90 tablet    Refill:  4   atorvastatin (LIPITOR) 40 MG tablet    Sig: Take 1 tablet (40 mg total) by mouth daily.    Dispense:  90 tablet    Refill:  4   Return for return to PCP, pending if symptoms worsen or fail to  improve.    Suanne Marker, MD 10/22/2023, 5:48 PM Certified in Neurology, Neurophysiology and Neuroimaging  [facility] [address] [place], [place] [address] [phone]

## 2023-10-22 NOTE — Patient Instructions (Addendum)
-   stop aspirin 81mg  daily  - continue clopidogrel 75mg  daily (long term)  - continue atorvastatin 40mg  daily

## 2023-10-23 DIAGNOSIS — L299 Pruritus, unspecified: Secondary | ICD-10-CM | POA: Diagnosis not present

## 2023-10-28 ENCOUNTER — Encounter: Payer: Self-pay | Admitting: Cardiovascular Disease

## 2023-10-28 ENCOUNTER — Ambulatory Visit: Payer: Medicare Other | Attending: Cardiovascular Disease | Admitting: Cardiovascular Disease

## 2023-10-28 VITALS — BP 108/58 | HR 57 | Ht 65.0 in | Wt 192.0 lb

## 2023-10-28 DIAGNOSIS — R002 Palpitations: Secondary | ICD-10-CM | POA: Diagnosis not present

## 2023-10-28 DIAGNOSIS — I639 Cerebral infarction, unspecified: Secondary | ICD-10-CM | POA: Diagnosis not present

## 2023-10-28 NOTE — Patient Instructions (Addendum)
 Medication Instructions:  STOP Metoprolol *If you need a refill on your cardiac medications before your next appointment, please call your pharmacy*   Testing/Procedures: Implantable Loop Recorder Your physician has recommended that you have a pacemaker inserted. A pacemaker is a small device that is placed under the skin of your chest or abdomen to help control abnormal heart rhythms. This device uses electrical pulses to prompt the heart to beat at a normal rate. Pacemakers are used to treat heart rhythms that are too slow. Wires (leads) are attached to the pacemaker that goes into the chambers of your heart. This is done in the hospital and usually requires an overnight stay. Please see the instruction sheet given to you today for more information.   Follow-Up: At [facility], you and your health needs are our priority.  As part of our continuing mission to provide you with exceptional heart care, we have created designated Provider Care Teams.  These Care Teams include your primary Cardiologist (physician) and Advanced Practice Providers (APPs -  Physician Assistants and Nurse Practitioners) who all work together to provide you with the care you need, when you need it.  We recommend signing up for the patient portal called "MyChart".  Sign up information is provided on this After Visit Summary.  MyChart is used to connect with patients for Virtual Visits (Telemedicine).  Patients are able to view lab/test results, encounter notes, upcoming appointments, etc.  Non-urgent messages can be sent to your provider as well.   To learn more about what you can do with MyChart, go to [email or web address].    Your next appointment:   We will contact you to schedule an appointment (6-8 weeks out preferably to ensure monitor completion and insurance approval)   Provider:   York Pellant, MD        1st Floor: - Lobby - Registration  - Pharmacy  - Lab - Cafe  2nd Floor: - PV  Lab - Diagnostic Testing (echo, CT, nuclear med)  3rd Floor: - Vacant  4th Floor: - TCTS (cardiothoracic surgery) - AFib Clinic - Structural Heart Clinic - Vascular Surgery  - Vascular Ultrasound  5th Floor: - HeartCare Cardiology (general and EP) - Clinical Pharmacy for coumadin, hypertension, lipid, weight-loss medications, and med management appointments    Valet parking services will be available as well.

## 2023-10-28 NOTE — Progress Notes (Signed)
  Electrophysiology Office Note:    Date:  10/28/2023   ID:  [name], DOB [birth year], MRN [id number]  PCP:  Emilio Aspen, MD   [facility] Providers Cardiologist:  None     Referring MD: Emilio Aspen, *   History of Present Illness:    [name] is a 78 y.o. male with a medical history significant for hypertension, hyperlipidemia, sleep apnea on CPAP, referred for possible loop placement.     He presented to the hospital in February 2025 with acute left-sided numbness and was found to have small acute infarcts in the right frontoparietal cortex concerning for cardiac embolic etiology.  A cardiac event monitor was ordered but has not yet completed.  He has had palpitations of the past for which he was prescribed metoprolol.  He reports that he is doing very well.  He is back to playing golf.  His only persistent symptom after the stroke is some numbness in the area of his left wrist.     Today, he reports that he is doing well.  He is not complaining of any dizziness, lightheadedness, presyncope, palpitations.  EKGs/Labs/Other Studies Reviewed Today:     Echocardiogram:  TTE September 27, 2023 EF 55 to 6%.  Grade 1 diastolic dysfunction   Monitors:  30 day monitor currently being worn  -- my interpretation Shows nocturnal AV block    EKG:   EKG Interpretation Date/Time:  Monday October 28 2023 08:43:46 EDT Ventricular Rate:  57 PR Interval:  254 QRS Duration:  88 QT Interval:  436 QTC Calculation: 424 R Axis:   38  Text Interpretation: sinus rhythm with second degree Type I AV block Nonspecific T wave abnormality When compared with ECG of 27-Sep-2023 03:29, Second-degree AV block has replaced first-degree AV block Confirmed by York Pellant [phone]) on 10/28/2023 9:01:53 AM     Physical Exam:    VS:  BP (!) 108/58 (BP Location: Left Arm, Patient Position: Sitting, Cuff Size: Large)   Pulse (!) 57   Ht 5\' 5"  (1.651 m)    Wt 192 lb (87.1 kg)   SpO2 97%   BMI 31.95 kg/m     Wt Readings from Last 3 Encounters:  10/28/23 192 lb (87.1 kg)  10/22/23 197 lb (89.4 kg)  09/27/23 192 lb 3.9 oz (87.2 kg)     GEN: Well nourished, well developed in no acute distress CARDIAC: RRR, no murmurs, rubs, gallops RESPIRATORY:  Normal work of breathing MUSCULOSKELETAL: no edema    ASSESSMENT & PLAN:     Stroke Multiple small acute infarcts in the right frontoparietal cortex concerning for embolism to right MCA Wearable monitor still in place Upon completion of monitor, if no A-fib is seen, we will recommend proceeding with loop recorder to exclude an arrhythmic cause of stroke  We discussed the indication and rationale for the loop recorder placement.  I explained risks of minor bleeding and infection.  I explained the monitoring process with the associated fee.  He is willing to proceed.  We will schedule the procedure and proceed after I review his current monitor and confirm the absence of A-fib.  Second degree type I AV block No significant symptoms Discontinue metoprolol      Signed, Maurice Small, MD  10/28/2023 9:04 AM    [facility]

## 2023-11-06 DIAGNOSIS — G4733 Obstructive sleep apnea (adult) (pediatric): Secondary | ICD-10-CM | POA: Diagnosis not present

## 2023-11-08 ENCOUNTER — Ambulatory Visit: Attending: Physician Assistant

## 2023-11-08 DIAGNOSIS — I639 Cerebral infarction, unspecified: Secondary | ICD-10-CM

## 2023-11-08 DIAGNOSIS — I440 Atrioventricular block, first degree: Secondary | ICD-10-CM

## 2023-11-28 DIAGNOSIS — I440 Atrioventricular block, first degree: Secondary | ICD-10-CM | POA: Diagnosis not present

## 2023-11-28 DIAGNOSIS — I639 Cerebral infarction, unspecified: Secondary | ICD-10-CM

## 2023-12-30 ENCOUNTER — Encounter: Payer: Self-pay | Admitting: Cardiovascular Disease

## 2023-12-30 ENCOUNTER — Ambulatory Visit: Attending: Cardiovascular Disease | Admitting: Cardiovascular Disease

## 2023-12-30 VITALS — BP 134/80 | HR 58 | Ht 65.0 in | Wt 190.6 lb

## 2023-12-30 DIAGNOSIS — I441 Atrioventricular block, second degree: Secondary | ICD-10-CM

## 2023-12-30 DIAGNOSIS — R002 Palpitations: Secondary | ICD-10-CM

## 2023-12-30 NOTE — Progress Notes (Signed)
 Electrophysiology Office Note:    Date:  12/30/2023   ID:  [name], DOB 09-18-1945, MRN [id number]  PCP:  Benedetta Bradley, MD   [facility] Providers Cardiologist:  None Electrophysiologist:  Efraim Grange, MD     Referring MD: Benedetta Bradley, *   History of Present Illness:    [name] is a 78 y.o. male with a medical history significant for hypertension, hyperlipidemia, sleep apnea on CPAP, referred for possible loop placement.     He presented to the hospital in February 2025 with acute left-sided numbness and was found to have small acute infarcts in the right frontoparietal cortex concerning for cardiac embolic etiology.  A cardiac event monitor was ordered but has not yet completed prior to our initial visit.  Since, the final result did not show any evidence of atrial fibrillation.  He has had palpitations of the past for which he was prescribed metoprolol .  He reports that he is doing very well.  He is back to playing golf.  His only persistent symptom after the stroke is some numbness in the area of his left wrist.     Today, he reports that he is doing well.  He is not complaining of any dizziness, lightheadedness, presyncope, palpitations.  EKGs/Labs/Other Studies Reviewed Today:     Echocardiogram:  TTE September 27, 2023 EF 55 to 6%.  Grade 1 diastolic dysfunction   Monitors:  30 day monitor currently being worn  -- my interpretation Shows nocturnal AV block    EKG:         Physical Exam:    VS:  BP 134/80   Pulse (!) 58   Ht 5\' 5"  (1.651 m)   Wt 190 lb 9.6 oz (86.5 kg)   SpO2 97%   BMI 31.72 kg/m     Wt Readings from Last 3 Encounters:  12/30/23 190 lb 9.6 oz (86.5 kg)  10/28/23 192 lb (87.1 kg)  10/22/23 197 lb (89.4 kg)     GEN: Well nourished, well developed in no acute distress CARDIAC: RRR, no murmurs, rubs, gallops RESPIRATORY:  Normal work of breathing MUSCULOSKELETAL: no  edema    ASSESSMENT & PLAN:     Stroke Multiple small acute infarcts in the right frontoparietal cortex concerning for embolism to right MCA Wearable monitor still in place Upon completion of monitor, if no A-fib is seen, we will recommend proceeding with loop recorder to exclude an arrhythmic cause of stroke  We discussed the indication and rationale for the loop recorder placement.  I explained risks of minor bleeding and infection.  I explained the monitoring process with the associated fee.  He is willing to proceed.  We will schedule the procedure and proceed after I review his current monitor and confirm the absence of A-fib.  Second degree type I AV block No significant symptoms Discontinue metoprolol  ECG today (12/30/23) shows blocked PACs rather than wenkebach    INTRODUCTION:   [name]  is a 78 y.o. patient with a history of cryptogenic stroke. Inpatient telemetry has been reviewed and not shown atrial fibrillation. The patient therefore presents today for implantable loop implantation. The costs of loop recorder monitoring have been discussed with the patient.    DESCRIPTION OF PROCEDURE:  Informed written consent was obtained.  A timeout was performed. The patient required no sedation for the procedure today.  The patients left chest was prepped and draped in the usual sterile fashion. The skin overlying  the left parasternal region was infiltrated with lidocaine  for local analgesia.  A 0.5-cm incision was made over the left parasternal region over the 3rd intercostal space.  A Medtronic implantable loop recorder (Linq II, [id number]) was then placed into the pocket  R waves were very prominent and measured >0.59mV.  Steri- Strips and a sterile dressing were then applied.  There were no early apparent complications.     CONCLUSIONS:   1. Successful implantation of a Medtronic implantable loop recorder for a history of cryptogenic stroke  2. No early apparent  complications.   Efraim Grange, MD  Cardiac Electrophysiology    Signed, Efraim Grange, MD  12/30/2023 8:40 AM    [facility]

## 2023-12-30 NOTE — Patient Instructions (Signed)
 Medication Instructions:  Your physician recommends that you continue on your current medications as directed. Please refer to the Current Medication list given to you today.  Labwork: None ordered.  Testing/Procedures: None ordered.  Follow-Up:  Your physician wants you to follow-up in: one year with Dr. York Pellant.  You will receive a reminder letter in the mail two months in advance. If you don't receive a letter, please call our office to schedule the follow-up appointment.  Implantable Loop Recorder Placement, Care After This sheet gives you information about how to care for yourself after your procedure. Your health care provider may also give you more specific instructions. If you have problems or questions, contact your health care provider.  What can I expect after the procedure? After the procedure, it is common to have: Soreness or discomfort near the incision. Some swelling or bruising near the incision.  Follow these instructions at home: Incision care  Monitor your cardiac device site for redness, swelling, and drainage. Call the device clinic at [phone] if you experience these symptoms or fever/chills.  Keep the large square bandage on your site for 24 hours and then you may remove it yourself. Keep the steri-strips underneath in place.   You may shower after 72 hours / 3 days from your procedure with the steri-strips in place. They will usually fall off on their own, or may be removed after 10 days. Pat dry.   Avoid lotions, ointments, or perfumes over your incision until it is well-healed.  Please do not submerge in water until your site is completely healed.   Your device is MRI compatible.   Remote monitoring is used to monitor your cardiac device from home. This monitoring is scheduled every month by our office. It allows [place] to keep an eye on the function of your device to ensure it is working properly.  If your wound site starts to bleed apply  pressure.    For help with the monitor please call Medtronic Monitor Support Specialist directly at [phone].    If you have any questions/concerns please call the device clinic at [phone].  Activity  Return to your normal activities.  General instructions Follow instructions from your health care provider about how to manage your implantable loop recorder and transmit the information. Learn how to activate a recording if this is necessary for your type of device. You may go through a metal detection gate, and you may let someone hold a metal detector over your chest. Show your ID card if needed. Do not have an MRI unless you check with your health care provider first. Take over-the-counter and prescription medicines only as told by your health care provider. Keep all follow-up visits as told by your health care provider. This is important. Contact a health care provider if: You have redness, swelling, or pain around your incision. You have a fever. You have pain that is not relieved by your pain medicine. You have triggered your device because of fainting (syncope) or because of a heartbeat that feels like it is racing, slow, fluttering, or skipping (palpitations). Get help right away if you have: Chest pain. Difficulty breathing. Summary After the procedure, it is common to have soreness or discomfort near the incision. Change your dressing as told by your health care provider. Follow instructions from your health care provider about how to manage your implantable loop recorder and transmit the information. Keep all follow-up visits as told by your health care provider. This is important. This information  is not intended to replace advice given to you by your health care provider. Make sure you discuss any questions you have with your health care provider. Document Released: 07/18/2015 Document Revised: 09/21/2017 Document Reviewed: 09/21/2017 Elsevier Patient Education   2020 [employer].

## 2024-01-03 DIAGNOSIS — I10 Essential (primary) hypertension: Secondary | ICD-10-CM | POA: Diagnosis not present

## 2024-01-03 DIAGNOSIS — D696 Thrombocytopenia, unspecified: Secondary | ICD-10-CM | POA: Diagnosis not present

## 2024-01-03 DIAGNOSIS — N183 Chronic kidney disease, stage 3 unspecified: Secondary | ICD-10-CM | POA: Diagnosis not present

## 2024-01-03 DIAGNOSIS — M25561 Pain in right knee: Secondary | ICD-10-CM | POA: Diagnosis not present

## 2024-01-26 DIAGNOSIS — S62637A Displaced fracture of distal phalanx of left little finger, initial encounter for closed fracture: Secondary | ICD-10-CM | POA: Diagnosis not present

## 2024-01-26 DIAGNOSIS — Z8673 Personal history of transient ischemic attack (TIA), and cerebral infarction without residual deficits: Secondary | ICD-10-CM | POA: Diagnosis not present

## 2024-01-26 DIAGNOSIS — S62637B Displaced fracture of distal phalanx of left little finger, initial encounter for open fracture: Secondary | ICD-10-CM | POA: Diagnosis not present

## 2024-01-26 DIAGNOSIS — Z7902 Long term (current) use of antithrombotics/antiplatelets: Secondary | ICD-10-CM | POA: Diagnosis not present

## 2024-01-26 DIAGNOSIS — I10 Essential (primary) hypertension: Secondary | ICD-10-CM | POA: Diagnosis not present

## 2024-01-27 DIAGNOSIS — M79645 Pain in left finger(s): Secondary | ICD-10-CM | POA: Diagnosis not present

## 2024-01-30 ENCOUNTER — Ambulatory Visit (INDEPENDENT_AMBULATORY_CARE_PROVIDER_SITE_OTHER)

## 2024-01-30 DIAGNOSIS — I639 Cerebral infarction, unspecified: Secondary | ICD-10-CM | POA: Diagnosis not present

## 2024-01-31 LAB — CUP PACEART REMOTE DEVICE CHECK
Date Time Interrogation Session: 20250612114707
Implantable Pulse Generator Implant Date: 20250512

## 2024-02-01 ENCOUNTER — Ambulatory Visit: Payer: Self-pay | Admitting: Cardiovascular Disease

## 2024-02-13 DIAGNOSIS — M79645 Pain in left finger(s): Secondary | ICD-10-CM | POA: Diagnosis not present

## 2024-02-25 DIAGNOSIS — M109 Gout, unspecified: Secondary | ICD-10-CM | POA: Diagnosis not present

## 2024-03-02 ENCOUNTER — Ambulatory Visit

## 2024-03-02 DIAGNOSIS — I639 Cerebral infarction, unspecified: Secondary | ICD-10-CM | POA: Diagnosis not present

## 2024-03-02 LAB — CUP PACEART REMOTE DEVICE CHECK
Date Time Interrogation Session: 20250714005453
Implantable Pulse Generator Implant Date: 20250512

## 2024-03-04 ENCOUNTER — Telehealth: Payer: Self-pay | Admitting: *Deleted

## 2024-03-04 ENCOUNTER — Ambulatory Visit: Payer: Self-pay | Admitting: Cardiovascular Disease

## 2024-03-04 NOTE — Telephone Encounter (Addendum)
 Patient called back to clinic and denied experiencing any symptoms during the event.  Patient instructed to call clinic if he becomes symptomatic   Patient verbalized understanding  Per MD, continue monitoring

## 2024-03-04 NOTE — Telephone Encounter (Signed)
 Device alert received from [employer]:  Alert remote transmission:  Tachy 1 new tachy event 7/15 @ 12:23, duration 10sec per device, HR 167.  EGM c/w NCT - route to triage for first tachy ____________________________________________________________________________  Presenting EGM c/w SR with PAC's   Episode in question appears to be atrial tachycardia  No OAC, BB, or AA noted in patient's active med list   Routing to [employer], MD for awareness based on implant diagnosis of cryptogenic stroke, first arrhythmia episode >20 beats, and the possibility of AT evolving into AFL/AF

## 2024-03-04 NOTE — Telephone Encounter (Signed)
 Called patient to assess for possible symptoms during episode. No answer. LMTCB.

## 2024-03-05 DIAGNOSIS — M79645 Pain in left finger(s): Secondary | ICD-10-CM | POA: Diagnosis not present

## 2024-03-24 NOTE — Progress Notes (Signed)
 Carelink Summary Report / Loop Recorder

## 2024-03-24 NOTE — Addendum Note (Signed)
 Addended by: VICCI SELLER A on: 03/24/2024 10:41 AM   Modules accepted: Orders

## 2024-04-02 ENCOUNTER — Ambulatory Visit (INDEPENDENT_AMBULATORY_CARE_PROVIDER_SITE_OTHER)

## 2024-04-02 DIAGNOSIS — I639 Cerebral infarction, unspecified: Secondary | ICD-10-CM

## 2024-04-02 LAB — CUP PACEART REMOTE DEVICE CHECK
Date Time Interrogation Session: 20250813234828
Implantable Pulse Generator Implant Date: 20250512

## 2024-04-05 ENCOUNTER — Ambulatory Visit: Payer: Self-pay | Admitting: Cardiovascular Disease

## 2024-05-04 ENCOUNTER — Ambulatory Visit (INDEPENDENT_AMBULATORY_CARE_PROVIDER_SITE_OTHER)

## 2024-05-04 DIAGNOSIS — I639 Cerebral infarction, unspecified: Secondary | ICD-10-CM

## 2024-05-05 LAB — CUP PACEART REMOTE DEVICE CHECK
Date Time Interrogation Session: 20250915000155
Implantable Pulse Generator Implant Date: 20250512

## 2024-05-07 DIAGNOSIS — Z23 Encounter for immunization: Secondary | ICD-10-CM | POA: Diagnosis not present

## 2024-05-07 DIAGNOSIS — E782 Mixed hyperlipidemia: Secondary | ICD-10-CM | POA: Diagnosis not present

## 2024-05-07 DIAGNOSIS — D649 Anemia, unspecified: Secondary | ICD-10-CM | POA: Diagnosis not present

## 2024-05-07 DIAGNOSIS — I672 Cerebral atherosclerosis: Secondary | ICD-10-CM | POA: Diagnosis not present

## 2024-05-07 DIAGNOSIS — I441 Atrioventricular block, second degree: Secondary | ICD-10-CM | POA: Diagnosis not present

## 2024-05-07 DIAGNOSIS — M25561 Pain in right knee: Secondary | ICD-10-CM | POA: Diagnosis not present

## 2024-05-07 DIAGNOSIS — M109 Gout, unspecified: Secondary | ICD-10-CM | POA: Diagnosis not present

## 2024-05-07 DIAGNOSIS — E1121 Type 2 diabetes mellitus with diabetic nephropathy: Secondary | ICD-10-CM | POA: Diagnosis not present

## 2024-05-07 DIAGNOSIS — I10 Essential (primary) hypertension: Secondary | ICD-10-CM | POA: Diagnosis not present

## 2024-05-07 DIAGNOSIS — Z0000 Encounter for general adult medical examination without abnormal findings: Secondary | ICD-10-CM | POA: Diagnosis not present

## 2024-05-07 DIAGNOSIS — R202 Paresthesia of skin: Secondary | ICD-10-CM | POA: Diagnosis not present

## 2024-05-07 DIAGNOSIS — Z79899 Other long term (current) drug therapy: Secondary | ICD-10-CM | POA: Diagnosis not present

## 2024-05-07 DIAGNOSIS — N183 Chronic kidney disease, stage 3 unspecified: Secondary | ICD-10-CM | POA: Diagnosis not present

## 2024-05-07 DIAGNOSIS — D696 Thrombocytopenia, unspecified: Secondary | ICD-10-CM | POA: Diagnosis not present

## 2024-05-07 DIAGNOSIS — E559 Vitamin D deficiency, unspecified: Secondary | ICD-10-CM | POA: Diagnosis not present

## 2024-05-09 ENCOUNTER — Ambulatory Visit: Payer: Self-pay | Admitting: Cardiovascular Disease

## 2024-05-11 NOTE — Progress Notes (Signed)
 Remote Loop Recorder Transmission

## 2024-05-14 NOTE — Progress Notes (Signed)
 Remote Loop Recorder Transmission

## 2024-05-27 NOTE — Progress Notes (Signed)
 Remote Loop Recorder Transmission

## 2024-06-03 ENCOUNTER — Ambulatory Visit (INDEPENDENT_AMBULATORY_CARE_PROVIDER_SITE_OTHER)

## 2024-06-03 DIAGNOSIS — I639 Cerebral infarction, unspecified: Secondary | ICD-10-CM

## 2024-06-04 ENCOUNTER — Encounter

## 2024-06-04 LAB — CUP PACEART REMOTE DEVICE CHECK
Date Time Interrogation Session: 20251014232856
Implantable Pulse Generator Implant Date: 20250512

## 2024-06-08 NOTE — Progress Notes (Signed)
 Remote Loop Recorder Transmission

## 2024-06-15 ENCOUNTER — Ambulatory Visit: Payer: Self-pay | Admitting: Cardiovascular Disease

## 2024-07-04 ENCOUNTER — Encounter

## 2024-07-05 ENCOUNTER — Ambulatory Visit: Attending: Cardiovascular Disease

## 2024-07-05 DIAGNOSIS — I639 Cerebral infarction, unspecified: Secondary | ICD-10-CM | POA: Diagnosis not present

## 2024-07-06 ENCOUNTER — Encounter

## 2024-07-06 LAB — CUP PACEART REMOTE DEVICE CHECK
Date Time Interrogation Session: 20251115233018
Implantable Pulse Generator Implant Date: 20250512

## 2024-07-08 NOTE — Progress Notes (Signed)
 Remote Loop Recorder Transmission

## 2024-07-20 ENCOUNTER — Ambulatory Visit: Payer: Self-pay | Admitting: Cardiovascular Disease

## 2024-08-04 ENCOUNTER — Encounter

## 2024-08-05 ENCOUNTER — Ambulatory Visit

## 2024-08-05 DIAGNOSIS — I639 Cerebral infarction, unspecified: Secondary | ICD-10-CM | POA: Diagnosis not present

## 2024-08-06 ENCOUNTER — Encounter

## 2024-08-06 LAB — CUP PACEART REMOTE DEVICE CHECK
Date Time Interrogation Session: 20251216233821
Implantable Pulse Generator Implant Date: 20250512

## 2024-08-06 NOTE — Progress Notes (Signed)
 Remote Loop Recorder Transmission

## 2024-08-19 ENCOUNTER — Ambulatory Visit: Payer: Self-pay | Admitting: Cardiovascular Disease

## 2024-09-04 ENCOUNTER — Encounter

## 2024-09-05 ENCOUNTER — Ambulatory Visit: Attending: Cardiovascular Disease

## 2024-09-05 DIAGNOSIS — I639 Cerebral infarction, unspecified: Secondary | ICD-10-CM

## 2024-09-07 ENCOUNTER — Encounter

## 2024-09-08 ENCOUNTER — Encounter (HOSPITAL_COMMUNITY): Payer: Self-pay | Admitting: Urology

## 2024-09-08 ENCOUNTER — Other Ambulatory Visit (HOSPITAL_COMMUNITY): Payer: Self-pay | Admitting: Urology

## 2024-09-08 DIAGNOSIS — R972 Elevated prostate specific antigen [PSA]: Secondary | ICD-10-CM

## 2024-09-08 LAB — CUP PACEART REMOTE DEVICE CHECK
Date Time Interrogation Session: 20260116232420
Implantable Pulse Generator Implant Date: 20250512

## 2024-09-10 ENCOUNTER — Telehealth: Payer: Self-pay

## 2024-09-10 NOTE — Progress Notes (Signed)
 Remote Loop Recorder Transmission

## 2024-09-10 NOTE — Telephone Encounter (Signed)
 Procedure clearance forms have been faxed over to [facility] and placed in Dr. Chancy office for review.

## 2024-09-12 ENCOUNTER — Ambulatory Visit: Payer: Self-pay | Admitting: Cardiovascular Disease

## 2024-10-05 ENCOUNTER — Encounter

## 2024-10-06 ENCOUNTER — Ambulatory Visit

## 2024-11-05 ENCOUNTER — Encounter

## 2024-11-06 ENCOUNTER — Ambulatory Visit

## 2024-12-06 ENCOUNTER — Encounter

## 2024-12-07 ENCOUNTER — Ambulatory Visit
